# Patient Record
Sex: Female | Born: 1995 | Hispanic: No | Marital: Married | Smoking: Never smoker
Health system: Southern US, Community
[De-identification: ages and names within clinical notes are randomized; demographics above are authoritative.]

## PROBLEM LIST (undated history)

## (undated) DIAGNOSIS — Z789 Other specified health status: Secondary | ICD-10-CM

## (undated) HISTORY — DX: Other specified health status: Z78.9

## (undated) HISTORY — PX: NO PAST SURGERIES: SHX2092

---

## 2016-06-29 NOTE — L&D Delivery Note (Signed)
Delivery Note At 11:55 AM a viable female was delivered via Vaginal, Spontaneous Delivery (Presentation: direct OA).  APGAR: 9, 9; weight  .   Placenta status: spontaneous.    Anesthesia: Epidural  Episiotomy: None Lacerations: 2nd degree;Labial Suture Repair: 3.0 vicryl Est. Blood Loss (mL): 200  Mom to postpartum.  Baby to Nursery.  Al CorpusMatthew R Zeitler 11/20/2016, 12:51 PM  Patient is a 1835w0d at 3335w0d who was admitted with SROM/latent labor, uncomplicated prenatal course.  She progressed without augmentation.  I was gloved and present for delivery in its entirety.  Second stage of labor progressed to SVD.  Mild decels during second stage noted.  Complications: none  Lacerations: 2nd degree perineal/L sulcal  EBL: 200cc  SHAW, KIMBERLY, CNM 1:36 PM 11/20/2016

## 2016-10-14 ENCOUNTER — Encounter: Payer: Self-pay | Admitting: Certified Nurse Midwife

## 2016-10-15 ENCOUNTER — Ambulatory Visit (INDEPENDENT_AMBULATORY_CARE_PROVIDER_SITE_OTHER): Payer: Medicaid Other | Admitting: Student

## 2016-10-15 ENCOUNTER — Encounter: Payer: Self-pay | Admitting: Student

## 2016-10-15 DIAGNOSIS — Z3403 Encounter for supervision of normal first pregnancy, third trimester: Secondary | ICD-10-CM

## 2016-10-15 DIAGNOSIS — Z3490 Encounter for supervision of normal pregnancy, unspecified, unspecified trimester: Secondary | ICD-10-CM | POA: Insufficient documentation

## 2016-10-15 DIAGNOSIS — Z23 Encounter for immunization: Secondary | ICD-10-CM

## 2016-10-15 DIAGNOSIS — Z3400 Encounter for supervision of normal first pregnancy, unspecified trimester: Secondary | ICD-10-CM | POA: Insufficient documentation

## 2016-10-15 DIAGNOSIS — O0933 Supervision of pregnancy with insufficient antenatal care, third trimester: Secondary | ICD-10-CM | POA: Insufficient documentation

## 2016-10-15 LAB — POCT URINALYSIS DIP (DEVICE)
BILIRUBIN URINE: NEGATIVE
Glucose, UA: NEGATIVE mg/dL
HGB URINE DIPSTICK: NEGATIVE
KETONES UR: NEGATIVE mg/dL
Nitrite: NEGATIVE
PROTEIN: NEGATIVE mg/dL
Specific Gravity, Urine: 1.015 (ref 1.005–1.030)
Urobilinogen, UA: 0.2 mg/dL (ref 0.0–1.0)
pH: 7 (ref 5.0–8.0)

## 2016-10-15 NOTE — Patient Instructions (Signed)
Third Trimester of Pregnancy The third trimester is from week 28 through week 40 (months 7 through 9). The third trimester is a time when the unborn baby (fetus) is growing rapidly. At the end of the ninth month, the fetus is about 20 inches in length and weighs 6-10 pounds. Body changes during your third trimester Your body will continue to go through many changes during pregnancy. The changes vary from woman to woman. During the third trimester:  Your weight will continue to increase. You can expect to gain 25-35 pounds (11-16 kg) by the end of the pregnancy.  You may begin to get stretch marks on your hips, abdomen, and breasts.  You may urinate more often because the fetus is moving lower into your pelvis and pressing on your bladder.  You may develop or continue to have heartburn. This is caused by increased hormones that slow down muscles in the digestive tract.  You may develop or continue to have constipation because increased hormones slow digestion and cause the muscles that push waste through your intestines to relax.  You may develop hemorrhoids. These are swollen veins (varicose veins) in the rectum that can itch or be painful.  You may develop swollen, bulging veins (varicose veins) in your legs.  You may have increased body aches in the pelvis, back, or thighs. This is due to weight gain and increased hormones that are relaxing your joints.  You may have changes in your hair. These can include thickening of your hair, rapid growth, and changes in texture. Some women also have hair loss during or after pregnancy, or hair that feels dry or thin. Your hair will most likely return to normal after your baby is born.  Your breasts will continue to grow and they will continue to become tender. A yellow fluid (colostrum) may leak from your breasts. This is the first milk you are producing for your baby.  Your belly button may stick out.  You may notice more swelling in your hands,  face, or ankles.  You may have increased tingling or numbness in your hands, arms, and legs. The skin on your belly may also feel numb.  You may feel short of breath because of your expanding uterus.  You may have more problems sleeping. This can be caused by the size of your belly, increased need to urinate, and an increase in your body's metabolism.  You may notice the fetus "dropping," or moving lower in your abdomen (lightening).  You may have increased vaginal discharge.  You may notice your joints feel loose and you may have pain around your pelvic bone.  What to expect at prenatal visits You will have prenatal exams every 2 weeks until week 36. Then you will have weekly prenatal exams. During a routine prenatal visit:  You will be weighed to make sure you and the baby are growing normally.  Your blood pressure will be taken.  Your abdomen will be measured to track your baby's growth.  The fetal heartbeat will be listened to.  Any test results from the previous visit will be discussed.  You may have a cervical check near your due date to see if your cervix has softened or thinned (effaced).  You will be tested for Group B streptococcus. This happens between 35 and 37 weeks.  Your health care provider may ask you:  What your birth plan is.  How you are feeling.  If you are feeling the baby move.  If you have had   any abnormal symptoms, such as leaking fluid, bleeding, severe headaches, or abdominal cramping.  If you are using any tobacco products, including cigarettes, chewing tobacco, and electronic cigarettes.  If you have any questions.  Other tests or screenings that may be performed during your third trimester include:  Blood tests that check for low iron levels (anemia).  Fetal testing to check the health, activity level, and growth of the fetus. Testing is done if you have certain medical conditions or if there are problems during the  pregnancy.  Nonstress test (NST). This test checks the health of your baby to make sure there are no signs of problems, such as the baby not getting enough oxygen. During this test, a belt is placed around your belly. The baby is made to move, and its heart rate is monitored during movement.  What is false labor? False labor is a condition in which you feel small, irregular tightenings of the muscles in the womb (contractions) that usually go away with rest, changing position, or drinking water. These are called Braxton Hicks contractions. Contractions may last for hours, days, or even weeks before true labor sets in. If contractions come at regular intervals, become more frequent, increase in intensity, or become painful, you should see your health care provider. What are the signs of labor?  Abdominal cramps.  Regular contractions that start at 10 minutes apart and become stronger and more frequent with time.  Contractions that start on the top of the uterus and spread down to the lower abdomen and back.  Increased pelvic pressure and dull back pain.  A watery or bloody mucus discharge that comes from the vagina.  Leaking of amniotic fluid. This is also known as your "water breaking." It could be a slow trickle or a gush. Let your health care provider know if it has a color or strange odor. If you have any of these signs, call your health care provider right away, even if it is before your due date. Follow these instructions at home: Medicines  Follow your health care provider's instructions regarding medicine use. Specific medicines may be either safe or unsafe to take during pregnancy.  Take a prenatal vitamin that contains at least 600 micrograms (mcg) of folic acid.  If you develop constipation, try taking a stool softener if your health care provider approves. Eating and drinking  Eat a balanced diet that includes fresh fruits and vegetables, whole grains, good sources of protein  such as meat, eggs, or tofu, and low-fat dairy. Your health care provider will help you determine the amount of weight gain that is right for you.  Avoid raw meat and uncooked cheese. These carry germs that can cause birth defects in the baby.  If you have low calcium intake from food, talk to your health care provider about whether you should take a daily calcium supplement.  Eat four or five small meals rather than three large meals a day.  Limit foods that are high in fat and processed sugars, such as fried and sweet foods.  To prevent constipation: ? Drink enough fluid to keep your urine clear or pale yellow. ? Eat foods that are high in fiber, such as fresh fruits and vegetables, whole grains, and beans. Activity  Exercise only as directed by your health care provider. Most women can continue their usual exercise routine during pregnancy. Try to exercise for 30 minutes at least 5 days a week. Stop exercising if you experience uterine contractions.  Avoid heavy   lifting.  Do not exercise in extreme heat or humidity, or at high altitudes.  Wear low-heel, comfortable shoes.  Practice good posture.  You may continue to have sex unless your health care provider tells you otherwise. Relieving pain and discomfort  Take frequent breaks and rest with your legs elevated if you have leg cramps or low back pain.  Take warm sitz baths to soothe any pain or discomfort caused by hemorrhoids. Use hemorrhoid cream if your health care provider approves.  Wear a good support bra to prevent discomfort from breast tenderness.  If you develop varicose veins: ? Wear support pantyhose or compression stockings as told by your healthcare provider. ? Elevate your feet for 15 minutes, 3-4 times a day. Prenatal care  Write down your questions. Take them to your prenatal visits.  Keep all your prenatal visits as told by your health care provider. This is important. Safety  Wear your seat belt at  all times when driving.  Make a list of emergency phone numbers, including numbers for family, friends, the hospital, and police and fire departments. General instructions  Avoid cat litter boxes and soil used by cats. These carry germs that can cause birth defects in the baby. If you have a cat, ask someone to clean the litter box for you.  Do not travel far distances unless it is absolutely necessary and only with the approval of your health care provider.  Do not use hot tubs, steam rooms, or saunas.  Do not drink alcohol.  Do not use any products that contain nicotine or tobacco, such as cigarettes and e-cigarettes. If you need help quitting, ask your health care provider.  Do not use any medicinal herbs or unprescribed drugs. These chemicals affect the formation and growth of the baby.  Do not douche or use tampons or scented sanitary pads.  Do not cross your legs for long periods of time.  To prepare for the arrival of your baby: ? Take prenatal classes to understand, practice, and ask questions about labor and delivery. ? Make a trial run to the hospital. ? Visit the hospital and tour the maternity area. ? Arrange for maternity or paternity leave through employers. ? Arrange for family and friends to take care of pets while you are in the hospital. ? Purchase a rear-facing car seat and make sure you know how to install it in your car. ? Pack your hospital bag. ? Prepare the baby's nursery. Make sure to remove all pillows and stuffed animals from the baby's crib to prevent suffocation.  Visit your dentist if you have not gone during your pregnancy. Use a soft toothbrush to brush your teeth and be gentle when you floss. Contact a health care provider if:  You are unsure if you are in labor or if your water has broken.  You become dizzy.  You have mild pelvic cramps, pelvic pressure, or nagging pain in your abdominal area.  You have lower back pain.  You have persistent  nausea, vomiting, or diarrhea.  You have an unusual or bad smelling vaginal discharge.  You have pain when you urinate. Get help right away if:  Your water breaks before 37 weeks.  You have regular contractions less than 5 minutes apart before 37 weeks.  You have a fever.  You are leaking fluid from your vagina.  You have spotting or bleeding from your vagina.  You have severe abdominal pain or cramping.  You have rapid weight loss or weight gain.    You have shortness of breath with chest pain.  You notice sudden or extreme swelling of your face, hands, ankles, feet, or legs.  Your baby makes fewer than 10 movements in 2 hours.  You have severe headaches that do not go away when you take medicine.  You have vision changes. Summary  The third trimester is from week 28 through week 40, months 7 through 9. The third trimester is a time when the unborn baby (fetus) is growing rapidly.  During the third trimester, your discomfort may increase as you and your baby continue to gain weight. You may have abdominal, leg, and back pain, sleeping problems, and an increased need to urinate.  During the third trimester your breasts will keep growing and they will continue to become tender. A yellow fluid (colostrum) may leak from your breasts. This is the first milk you are producing for your baby.  False labor is a condition in which you feel small, irregular tightenings of the muscles in the womb (contractions) that eventually go away. These are called Braxton Hicks contractions. Contractions may last for hours, days, or even weeks before true labor sets in.  Signs of labor can include: abdominal cramps; regular contractions that start at 10 minutes apart and become stronger and more frequent with time; watery or bloody mucus discharge that comes from the vagina; increased pelvic pressure and dull back pain; and leaking of amniotic fluid. This information is not intended to replace advice  given to you by your health care provider. Make sure you discuss any questions you have with your health care provider. Document Released: 06/09/2001 Document Revised: 11/21/2015 Document Reviewed: 08/16/2012 Elsevier Interactive Patient Education  2017 [employer]  

## 2016-10-15 NOTE — Progress Notes (Signed)
  Subjective:    Kaitlyn Frank is being seen today for her first obstetrical visit.  This is a planned pregnancy. She is at [redacted]w[redacted]d gestation. Her obstetrical history is significant for nothing. Relationship with FOB: living in [place]. Patient does intend to breast feed. Pregnancy history fully reviewed. States that her pregnancy has been normal thus far and that her anatomy scan in [place] was normal.  Patient reports backache.  Review of Systems:   Review of Systems  Constitutional: Negative.   HENT: Negative.   Eyes: Negative.   Respiratory: Negative.   Cardiovascular: Negative.   Gastrointestinal: Negative.   Genitourinary: Negative.   Musculoskeletal: Negative.   Allergic/Immunologic: Negative.   Neurological: Negative.   Psychiatric/Behavioral: Negative.     Objective:     BP (!) 111/59   Pulse 91   Ht  (1.6 m)   Wt 54.5 kg (120 lb 1.6 oz)   LMP 02/17/2016   BMI 21.27 kg/m  Physical Exam  Constitutional: She is oriented to person, place, and time. She appears well-developed.  HENT:  Head: Normocephalic.  Neck: Normal range of motion.  Cardiovascular: Normal rate and regular rhythm.   Respiratory: Effort normal and breath sounds normal.  GI: Soft. Bowel sounds are normal.  Musculoskeletal: Normal range of motion.  Neurological: She is alert and oriented to person, place, and time. She has normal reflexes.  Skin: Skin is warm and dry.  Psychiatric: She has a normal mood and affect.    Exam    Assessment:    Pregnancy: G1P0000 Patient Active Problem List   Diagnosis Date Noted  . Supervision of low-risk first pregnancy 10/15/2016       Plan:      Patient Active Problem List   Diagnosis Date Noted  . Supervision of low-risk first pregnancy 10/15/2016    Initial labs drawn. 1 hour GTT today Prenatal vitamins. Problem list reviewed and updated. AFP3 discussed: too late. Role of ultrasound in pregnancy discussed; fetal survey: follow up  ordered for growth and repeat anatomy. Amniocentesis discussed: not indicated. Follow up in 1 weeks. 50% of 45 min visit spent on counseling and coordination of care.     Charlesetta Garibaldi Kooistra CNM 10/15/2016

## 2016-10-16 LAB — OBSTETRIC PANEL, INCLUDING HIV
Antibody Screen: NEGATIVE
Basophils Absolute: 0 10*3/uL (ref 0.0–0.2)
Basos: 0 %
EOS (ABSOLUTE): 0.1 10*3/uL (ref 0.0–0.4)
Eos: 1 %
HIV SCREEN 4TH GENERATION: NONREACTIVE
Hematocrit: 26.2 % — ABNORMAL LOW (ref 34.0–46.6)
Hemoglobin: 8.5 g/dL — ABNORMAL LOW (ref 11.1–15.9)
Hepatitis B Surface Ag: NEGATIVE
IMMATURE GRANULOCYTES: 1 %
Immature Grans (Abs): 0.1 10*3/uL (ref 0.0–0.1)
LYMPHS ABS: 2.2 10*3/uL (ref 0.7–3.1)
Lymphs: 27 %
MCH: 28.2 pg (ref 26.6–33.0)
MCHC: 32.4 g/dL (ref 31.5–35.7)
MCV: 87 fL (ref 79–97)
Monocytes Absolute: 0.7 10*3/uL (ref 0.1–0.9)
Monocytes: 8 %
NEUTROS ABS: 5.2 10*3/uL (ref 1.4–7.0)
NEUTROS PCT: 63 %
Platelets: 235 10*3/uL (ref 150–379)
RBC: 3.01 x10E6/uL — AB (ref 3.77–5.28)
RDW: 14.8 % (ref 12.3–15.4)
RPR Ser Ql: NONREACTIVE
Rh Factor: POSITIVE
Rubella Antibodies, IGG: 15.6 index (ref >0.99)
WBC: 8.1 10*3/uL (ref 3.4–10.8)

## 2016-10-16 LAB — HEMOGLOBINOPATHY EVALUATION
FERRITIN: 8 ng/mL — AB (ref 15–150)
HGB SOLUBILITY: NEGATIVE
Hgb A2 Quant: 2.1 % (ref 1.8–3.2)
Hgb A: 97.9 % (ref 96.4–98.8)
Hgb C: 0 %
Hgb F Quant: 0 % (ref 0.0–2.0)
Hgb S: 0 %
Hgb Variant: 0 %

## 2016-10-16 LAB — GLUCOSE TOLERANCE, 1 HOUR: GLUCOSE, 1HR PP: 114 mg/dL (ref 65–199)

## 2016-10-18 ENCOUNTER — Encounter: Payer: Self-pay | Admitting: Student

## 2016-10-18 ENCOUNTER — Telehealth: Payer: Self-pay | Admitting: Student

## 2016-10-18 ENCOUNTER — Other Ambulatory Visit: Payer: Self-pay | Admitting: Student

## 2016-10-18 DIAGNOSIS — O99013 Anemia complicating pregnancy, third trimester: Secondary | ICD-10-CM | POA: Insufficient documentation

## 2016-10-18 MED ORDER — FERROUS SULFATE 325 (65 FE) MG PO TABS: 325.0000 mg | ORAL_TABLET | Freq: Two times a day (BID) | ORAL | Status: AC

## 2016-10-18 NOTE — Telephone Encounter (Signed)
RX for 325 mg of ferrous sulfate daily sent to pharmacy. Spoke with Patient; informed her of her low iron.  Patient had just bought her own iron pills and she planned to take them first. I verified that they were the same dosage. Informed patient of importance of taking her iron with citrus and avoiding any dairy products 30 min before and after taking the pills. Also recommended stool softener for potential constipation. Patient verbalized understanding; all questions answered.

## 2016-10-19 LAB — CULTURE, OB URINE

## 2016-10-19 LAB — URINE CULTURE, OB REFLEX

## 2016-10-26 ENCOUNTER — Other Ambulatory Visit (HOSPITAL_COMMUNITY)
Admission: RE | Admit: 2016-10-26 | Discharge: 2016-10-26 | Disposition: A | Discharge status: Home / Self Care | Payer: Medicaid Other | Source: Ambulatory Visit | Attending: Advanced Practice Midwife | Admitting: Advanced Practice Midwife

## 2016-10-26 ENCOUNTER — Encounter: Payer: Self-pay | Admitting: Advanced Practice Midwife

## 2016-10-26 ENCOUNTER — Ambulatory Visit (INDEPENDENT_AMBULATORY_CARE_PROVIDER_SITE_OTHER): Payer: Medicaid Other | Admitting: Advanced Practice Midwife

## 2016-10-26 ENCOUNTER — Ambulatory Visit (HOSPITAL_COMMUNITY)
Admission: RE | Admit: 2016-10-26 | Discharge: 2016-10-26 | Disposition: A | Discharge status: Home / Self Care | Payer: Medicaid Other | Source: Ambulatory Visit | Attending: Student | Admitting: Student

## 2016-10-26 VITALS — BP 98/68 | HR 111 | Wt 121.1 lb

## 2016-10-26 DIAGNOSIS — O0933 Supervision of pregnancy with insufficient antenatal care, third trimester: Secondary | ICD-10-CM | POA: Diagnosis not present

## 2016-10-26 DIAGNOSIS — Z113 Encounter for screening for infections with a predominantly sexual mode of transmission: Secondary | ICD-10-CM

## 2016-10-26 DIAGNOSIS — Z3403 Encounter for supervision of normal first pregnancy, third trimester: Secondary | ICD-10-CM

## 2016-10-26 DIAGNOSIS — Z3A36 36 weeks gestation of pregnancy: Secondary | ICD-10-CM | POA: Insufficient documentation

## 2016-10-26 MED ORDER — PENICILLIN V POTASSIUM 500 MG PO TABS: 500.0000 mg | ORAL_TABLET | Freq: Four times a day (QID) | ORAL | 0 refills | Status: DC

## 2016-10-26 NOTE — Progress Notes (Signed)
   PRENATAL VISIT NOTE  Subjective:  Kaitlyn Frank is a 21 y.o. G1P0000 at [redacted]w[redacted]d being seen today for ongoing prenatal care.  She is currently monitored for the following issues for this low-risk pregnancy and has Supervision of low-risk first pregnancy; Late prenatal care affecting pregnancy in third trimester; and Anemia affecting pregnancy in third trimester on her problem list.  Patient reports occasional contractions.  Contractions: Irregular. Vag. Bleeding: None.  Movement: Present. Denies leaking of fluid.   The following portions of the patient's history were reviewed and updated as appropriate: allergies, current medications, past family history, past medical history, past social history, past surgical history and problem list. Problem list updated.  Objective:   Vitals:   10/26/16 1254  BP: 98/68  Pulse: (!) 111  Weight: 121 lb 1.6 oz (54.9 kg)    Fetal Status: Fetal Heart Rate (bpm): 142   Movement: Present     General:  Alert, oriented and cooperative. Patient is in no acute distress.  Skin: Skin is warm and dry. No rash noted.   Cardiovascular: Normal heart rate noted  Respiratory: Normal respiratory effort, no problems with respiration noted  Abdomen: Soft, gravid, appropriate for gestational age. Pain/Pressure: Present     Pelvic:  Cervical exam performed       Closed/40/Ballot/vertex  Extremities: Normal range of motion.  Edema: None  Mental Status: Normal mood and affect. Normal behavior. Normal judgment and thought content.   Assessment and Plan:  Pregnancy: G1P0000 at [redacted]w[redacted]d  1. Encounter for supervision of low-risk first pregnancy in third trimester      GBS not done due to Presence in urine - GC/Chlamydia probe amp ([facility])not at [facility]  2. Late prenatal care affecting pregnancy in third trimester      GBS bacteruria        Rx PCN x 7d sent to pharmacy       Will treat in labor - GC/Chlamydia probe amp ()not at [facility]  Term labor  symptoms and general obstetric precautions including but not limited to vaginal bleeding, contractions, leaking of fluid and fetal movement were reviewed in detail with the patient. Please refer to After Visit Summary for other counseling recommendations.  Return in 1 week (on 11/02/2016).   Aviva Signs, CNM

## 2016-10-26 NOTE — Patient Instructions (Addendum)
Third Trimester of Pregnancy The third trimester is from week 28 through week 40 (months 7 through 9). The third trimester is a time when the unborn baby (fetus) is growing rapidly. At the end of the ninth month, the fetus is about 20 inches in length and weighs 6-10 pounds. Body changes during your third trimester Your body will continue to go through many changes during pregnancy. The changes vary from woman to woman. During the third trimester:  Your weight will continue to increase. You can expect to gain 25-35 pounds (11-16 kg) by the end of the pregnancy.  You may begin to get stretch marks on your hips, abdomen, and breasts.  You may urinate more often because the fetus is moving lower into your pelvis and pressing on your bladder.  You may develop or continue to have heartburn. This is caused by increased hormones that slow down muscles in the digestive tract.  You may develop or continue to have constipation because increased hormones slow digestion and cause the muscles that push waste through your intestines to relax.  You may develop hemorrhoids. These are swollen veins (varicose veins) in the rectum that can itch or be painful.  You may develop swollen, bulging veins (varicose veins) in your legs.  You may have increased body aches in the pelvis, back, or thighs. This is due to weight gain and increased hormones that are relaxing your joints.  You may have changes in your hair. These can include thickening of your hair, rapid growth, and changes in texture. Some women also have hair loss during or after pregnancy, or hair that feels dry or thin. Your hair will most likely return to normal after your baby is born.  Your breasts will continue to grow and they will continue to become tender. A yellow fluid (colostrum) may leak from your breasts. This is the first milk you are producing for your baby.  Your belly button may stick out.  You may notice more swelling in your hands,  face, or ankles.  You may have increased tingling or numbness in your hands, arms, and legs. The skin on your belly may also feel numb.  You may feel short of breath because of your expanding uterus.  You may have more problems sleeping. This can be caused by the size of your belly, increased need to urinate, and an increase in your body's metabolism.  You may notice the fetus "dropping," or moving lower in your abdomen (lightening).  You may have increased vaginal discharge.  You may notice your joints feel loose and you may have pain around your pelvic bone.  What to expect at prenatal visits You will have prenatal exams every 2 weeks until week 36. Then you will have weekly prenatal exams. During a routine prenatal visit:  You will be weighed to make sure you and the baby are growing normally.  Your blood pressure will be taken.  Your abdomen will be measured to track your baby's growth.  The fetal heartbeat will be listened to.  Any test results from the previous visit will be discussed.  You may have a cervical check near your due date to see if your cervix has softened or thinned (effaced).  You will be tested for Group B streptococcus. This happens between 35 and 37 weeks.  Your health care provider may ask you:  What your birth plan is.  How you are feeling.  If you are feeling the baby move.  If you have had   any abnormal symptoms, such as leaking fluid, bleeding, severe headaches, or abdominal cramping.  If you are using any tobacco products, including cigarettes, chewing tobacco, and electronic cigarettes.  If you have any questions.  Other tests or screenings that may be performed during your third trimester include:  Blood tests that check for low iron levels (anemia).  Fetal testing to check the health, activity level, and growth of the fetus. Testing is done if you have certain medical conditions or if there are problems during the  pregnancy.  Nonstress test (NST). This test checks the health of your baby to make sure there are no signs of problems, such as the baby not getting enough oxygen. During this test, a belt is placed around your belly. The baby is made to move, and its heart rate is monitored during movement.  What is false labor? False labor is a condition in which you feel small, irregular tightenings of the muscles in the womb (contractions) that usually go away with rest, changing position, or drinking water. These are called Braxton Hicks contractions. Contractions may last for hours, days, or even weeks before true labor sets in. If contractions come at regular intervals, become more frequent, increase in intensity, or become painful, you should see your health care provider. What are the signs of labor?  Abdominal cramps.  Regular contractions that start at 10 minutes apart and become stronger and more frequent with time.  Contractions that start on the top of the uterus and spread down to the lower abdomen and back.  Increased pelvic pressure and dull back pain.  A watery or bloody mucus discharge that comes from the vagina.  Leaking of amniotic fluid. This is also known as your "water breaking." It could be a slow trickle or a gush. Let your health care provider know if it has a color or strange odor. If you have any of these signs, call your health care provider right away, even if it is before your due date. Follow these instructions at home: Medicines  Follow your health care provider's instructions regarding medicine use. Specific medicines may be either safe or unsafe to take during pregnancy.  Take a prenatal vitamin that contains at least 600 micrograms (mcg) of folic acid.  If you develop constipation, try taking a stool softener if your health care provider approves. Eating and drinking  Eat a balanced diet that includes fresh fruits and vegetables, whole grains, good sources of protein  such as meat, eggs, or tofu, and low-fat dairy. Your health care provider will help you determine the amount of weight gain that is right for you.  Avoid raw meat and uncooked cheese. These carry germs that can cause birth defects in the baby.  If you have low calcium intake from food, talk to your health care provider about whether you should take a daily calcium supplement.  Eat four or five small meals rather than three large meals a day.  Limit foods that are high in fat and processed sugars, such as fried and sweet foods.  To prevent constipation: ? Drink enough fluid to keep your urine clear or pale yellow. ? Eat foods that are high in fiber, such as fresh fruits and vegetables, whole grains, and beans. Activity  Exercise only as directed by your health care provider. Most women can continue their usual exercise routine during pregnancy. Try to exercise for 30 minutes at least 5 days a week. Stop exercising if you experience uterine contractions.  Avoid heavy   lifting.  Do not exercise in extreme heat or humidity, or at high altitudes.  Wear low-heel, comfortable shoes.  Practice good posture.  You may continue to have sex unless your health care provider tells you otherwise. Relieving pain and discomfort  Take frequent breaks and rest with your legs elevated if you have leg cramps or low back pain.  Take warm sitz baths to soothe any pain or discomfort caused by hemorrhoids. Use hemorrhoid cream if your health care provider approves.  Wear a good support bra to prevent discomfort from breast tenderness.  If you develop varicose veins: ? Wear support pantyhose or compression stockings as told by your healthcare provider. ? Elevate your feet for 15 minutes, 3-4 times a day. Prenatal care  Write down your questions. Take them to your prenatal visits.  Keep all your prenatal visits as told by your health care provider. This is important. Safety  Wear your seat belt at  all times when driving.  Make a list of emergency phone numbers, including numbers for family, friends, the hospital, and police and fire departments. General instructions  Avoid cat litter boxes and soil used by cats. These carry germs that can cause birth defects in the baby. If you have a cat, ask someone to clean the litter box for you.  Do not travel far distances unless it is absolutely necessary and only with the approval of your health care provider.  Do not use hot tubs, steam rooms, or saunas.  Do not drink alcohol.  Do not use any products that contain nicotine or tobacco, such as cigarettes and e-cigarettes. If you need help quitting, ask your health care provider.  Do not use any medicinal herbs or unprescribed drugs. These chemicals affect the formation and growth of the baby.  Do not douche or use tampons or scented sanitary pads.  Do not cross your legs for long periods of time.  To prepare for the arrival of your baby: ? Take prenatal classes to understand, practice, and ask questions about labor and delivery. ? Make a trial run to the hospital. ? Visit the hospital and tour the maternity area. ? Arrange for maternity or paternity leave through employers. ? Arrange for family and friends to take care of pets while you are in the hospital. ? Purchase a rear-facing car seat and make sure you know how to install it in your car. ? Pack your hospital bag. ? Prepare the baby's nursery. Make sure to remove all pillows and stuffed animals from the baby's crib to prevent suffocation.  Visit your dentist if you have not gone during your pregnancy. Use a soft toothbrush to brush your teeth and be gentle when you floss. Contact a health care provider if:  You are unsure if you are in labor or if your water has broken.  You become dizzy.  You have mild pelvic cramps, pelvic pressure, or nagging pain in your abdominal area.  You have lower back pain.  You have persistent  nausea, vomiting, or diarrhea.  You have an unusual or bad smelling vaginal discharge.  You have pain when you urinate. Get help right away if:  Your water breaks before 37 weeks.  You have regular contractions less than 5 minutes apart before 37 weeks.  You have a fever.  You are leaking fluid from your vagina.  You have spotting or bleeding from your vagina.  You have severe abdominal pain or cramping.  You have rapid weight loss or weight gain.    You have shortness of breath with chest pain.  You notice sudden or extreme swelling of your face, hands, ankles, feet, or legs.  Your baby makes fewer than 10 movements in 2 hours.  You have severe headaches that do not go away when you take medicine.  You have vision changes. Summary  The third trimester is from week 28 through week 40, months 7 through 9. The third trimester is a time when the unborn baby (fetus) is growing rapidly.  During the third trimester, your discomfort may increase as you and your baby continue to gain weight. You may have abdominal, leg, and back pain, sleeping problems, and an increased need to urinate.  During the third trimester your breasts will keep growing and they will continue to become tender. A yellow fluid (colostrum) may leak from your breasts. This is the first milk you are producing for your baby.  False labor is a condition in which you feel small, irregular tightenings of the muscles in the womb (contractions) that eventually go away. These are called Braxton Hicks contractions. Contractions may last for hours, days, or even weeks before true labor sets in.  Signs of labor can include: abdominal cramps; regular contractions that start at 10 minutes apart and become stronger and more frequent with time; watery or bloody mucus discharge that comes from the vagina; increased pelvic pressure and dull back pain; and leaking of amniotic fluid. This information is not intended to replace advice  given to you by your health care provider. Make sure you discuss any questions you have with your health care provider. Document Released: 06/09/2001 Document Revised: 11/21/2015 Document Reviewed: 08/16/2012 [employer] Interactive Patient Education  2017 [employer]  Third Trimester of Pregnancy The third trimester is from week 28 through week 40 (months 7 through 9). The third trimester is a time when the unborn baby (fetus) is growing rapidly. At the end of the ninth month, the fetus is about 20 inches in length and weighs 6-10 pounds. Body changes during your third trimester Your body will continue to go through many changes during pregnancy. The changes vary from woman to woman. During the third trimester:  Your weight will continue to increase. You can expect to gain 25-35 pounds (11-16 kg) by the end of the pregnancy.  You may begin to get stretch marks on your hips, abdomen, and breasts.  You may urinate more often because the fetus is moving lower into your pelvis and pressing on your bladder.  You may develop or continue to have heartburn. This is caused by increased hormones that slow down muscles in the digestive tract.  You may develop or continue to have constipation because increased hormones slow digestion and cause the muscles that push waste through your intestines to relax.  You may develop hemorrhoids. These are swollen veins (varicose veins) in the rectum that can itch or be painful.  You may develop swollen, bulging veins (varicose veins) in your legs.  You may have increased body aches in the pelvis, back, or thighs. This is due to weight gain and increased hormones that are relaxing your joints.  You may have changes in your hair. These can include thickening of your hair, rapid growth, and changes in texture. Some women also have hair loss during or after pregnancy, or hair that feels dry or thin. Your hair will most likely return to normal after your baby is  born.  Your breasts will continue to grow and they will continue to become   tender. A yellow fluid (colostrum) may leak from your breasts. This is the first milk you are producing for your baby.  Your belly button may stick out.  You may notice more swelling in your hands, face, or ankles.  You may have increased tingling or numbness in your hands, arms, and legs. The skin on your belly may also feel numb.  You may feel short of breath because of your expanding uterus.  You may have more problems sleeping. This can be caused by the size of your belly, increased need to urinate, and an increase in your body's metabolism.  You may notice the fetus "dropping," or moving lower in your abdomen (lightening).  You may have increased vaginal discharge.  You may notice your joints feel loose and you may have pain around your pelvic bone.  What to expect at prenatal visits You will have prenatal exams every 2 weeks until week 36. Then you will have weekly prenatal exams. During a routine prenatal visit:  You will be weighed to make sure you and the baby are growing normally.  Your blood pressure will be taken.  Your abdomen will be measured to track your baby's growth.  The fetal heartbeat will be listened to.  Any test results from the previous visit will be discussed.  You may have a cervical check near your due date to see if your cervix has softened or thinned (effaced).  You will be tested for Group B streptococcus. This happens between 35 and 37 weeks.  Your health care provider may ask you:  What your birth plan is.  How you are feeling.  If you are feeling the baby move.  If you have had any abnormal symptoms, such as leaking fluid, bleeding, severe headaches, or abdominal cramping.  If you are using any tobacco products, including cigarettes, chewing tobacco, and electronic cigarettes.  If you have any questions.  Other tests or screenings that may be performed during  your third trimester include:  Blood tests that check for low iron levels (anemia).  Fetal testing to check the health, activity level, and growth of the fetus. Testing is done if you have certain medical conditions or if there are problems during the pregnancy.  Nonstress test (NST). This test checks the health of your baby to make sure there are no signs of problems, such as the baby not getting enough oxygen. During this test, a belt is placed around your belly. The baby is made to move, and its heart rate is monitored during movement.  What is false labor? False labor is a condition in which you feel small, irregular tightenings of the muscles in the womb (contractions) that usually go away with rest, changing position, or drinking water. These are called Braxton Hicks contractions. Contractions may last for hours, days, or even weeks before true labor sets in. If contractions come at regular intervals, become more frequent, increase in intensity, or become painful, you should see your health care provider. What are the signs of labor?  Abdominal cramps.  Regular contractions that start at 10 minutes apart and become stronger and more frequent with time.  Contractions that start on the top of the uterus and spread down to the lower abdomen and back.  Increased pelvic pressure and dull back pain.  A watery or bloody mucus discharge that comes from the vagina.  Leaking of amniotic fluid. This is also known as your "water breaking." It could be a slow trickle or a gush. Let   your health care provider know if it has a color or strange odor. If you have any of these signs, call your health care provider right away, even if it is before your due date. Follow these instructions at home: Medicines  Follow your health care provider's instructions regarding medicine use. Specific medicines may be either safe or unsafe to take during pregnancy.  Take a prenatal vitamin that contains at least 600  micrograms (mcg) of folic acid.  If you develop constipation, try taking a stool softener if your health care provider approves. Eating and drinking  Eat a balanced diet that includes fresh fruits and vegetables, whole grains, good sources of protein such as meat, eggs, or tofu, and low-fat dairy. Your health care provider will help you determine the amount of weight gain that is right for you.  Avoid raw meat and uncooked cheese. These carry germs that can cause birth defects in the baby.  If you have low calcium intake from food, talk to your health care provider about whether you should take a daily calcium supplement.  Eat four or five small meals rather than three large meals a day.  Limit foods that are high in fat and processed sugars, such as fried and sweet foods.  To prevent constipation: ? Drink enough fluid to keep your urine clear or pale yellow. ? Eat foods that are high in fiber, such as fresh fruits and vegetables, whole grains, and beans. Activity  Exercise only as directed by your health care provider. Most women can continue their usual exercise routine during pregnancy. Try to exercise for 30 minutes at least 5 days a week. Stop exercising if you experience uterine contractions.  Avoid heavy lifting.  Do not exercise in extreme heat or humidity, or at high altitudes.  Wear low-heel, comfortable shoes.  Practice good posture.  You may continue to have sex unless your health care provider tells you otherwise. Relieving pain and discomfort  Take frequent breaks and rest with your legs elevated if you have leg cramps or low back pain.  Take warm sitz baths to soothe any pain or discomfort caused by hemorrhoids. Use hemorrhoid cream if your health care provider approves.  Wear a good support bra to prevent discomfort from breast tenderness.  If you develop varicose veins: ? Wear support pantyhose or compression stockings as told by your healthcare  provider. ? Elevate your feet for 15 minutes, 3-4 times a day. Prenatal care  Write down your questions. Take them to your prenatal visits.  Keep all your prenatal visits as told by your health care provider. This is important. Safety  Wear your seat belt at all times when driving.  Make a list of emergency phone numbers, including numbers for family, friends, the hospital, and police and fire departments. General instructions  Avoid cat litter boxes and soil used by cats. These carry germs that can cause birth defects in the baby. If you have a cat, ask someone to clean the litter box for you.  Do not travel far distances unless it is absolutely necessary and only with the approval of your health care provider.  Do not use hot tubs, steam rooms, or saunas.  Do not drink alcohol.  Do not use any products that contain nicotine or tobacco, such as cigarettes and e-cigarettes. If you need help quitting, ask your health care provider.  Do not use any medicinal herbs or unprescribed drugs. These chemicals affect the formation and growth of the baby.  Do   not douche or use tampons or scented sanitary pads.  Do not cross your legs for long periods of time.  To prepare for the arrival of your baby: ? Take prenatal classes to understand, practice, and ask questions about labor and delivery. ? Make a trial run to the hospital. ? Visit the hospital and tour the maternity area. ? Arrange for maternity or paternity leave through employers. ? Arrange for family and friends to take care of pets while you are in the hospital. ? Purchase a rear-facing car seat and make sure you know how to install it in your car. ? Pack your hospital bag. ? Prepare the baby's nursery. Make sure to remove all pillows and stuffed animals from the baby's crib to prevent suffocation.  Visit your dentist if you have not gone during your pregnancy. Use a soft toothbrush to brush your teeth and be gentle when you  floss. Contact a health care provider if:  You are unsure if you are in labor or if your water has broken.  You become dizzy.  You have mild pelvic cramps, pelvic pressure, or nagging pain in your abdominal area.  You have lower back pain.  You have persistent nausea, vomiting, or diarrhea.  You have an unusual or bad smelling vaginal discharge.  You have pain when you urinate. Get help right away if:  Your water breaks before 37 weeks.  You have regular contractions less than 5 minutes apart before 37 weeks.  You have a fever.  You are leaking fluid from your vagina.  You have spotting or bleeding from your vagina.  You have severe abdominal pain or cramping.  You have rapid weight loss or weight gain.  You have shortness of breath with chest pain.  You notice sudden or extreme swelling of your face, hands, ankles, feet, or legs.  Your baby makes fewer than 10 movements in 2 hours.  You have severe headaches that do not go away when you take medicine.  You have vision changes. Summary  The third trimester is from week 28 through week 40, months 7 through 9. The third trimester is a time when the unborn baby (fetus) is growing rapidly.  During the third trimester, your discomfort may increase as you and your baby continue to gain weight. You may have abdominal, leg, and back pain, sleeping problems, and an increased need to urinate.  During the third trimester your breasts will keep growing and they will continue to become tender. A yellow fluid (colostrum) may leak from your breasts. This is the first milk you are producing for your baby.  False labor is a condition in which you feel small, irregular tightenings of the muscles in the womb (contractions) that eventually go away. These are called Braxton Hicks contractions. Contractions may last for hours, days, or even weeks before true labor sets in.  Signs of labor can include: abdominal cramps; regular  contractions that start at 10 minutes apart and become stronger and more frequent with time; watery or bloody mucus discharge that comes from the vagina; increased pelvic pressure and dull back pain; and leaking of amniotic fluid. This information is not intended to replace advice given to you by your health care provider. Make sure you discuss any questions you have with your health care provider. Document Released: 06/09/2001 Document Revised: 11/21/2015 Document Reviewed: 08/16/2012 [employer] Interactive Patient Education  2017 [employer]  

## 2016-10-27 LAB — GC/CHLAMYDIA PROBE AMP (~~LOC~~) NOT AT ARMC
Chlamydia: NEGATIVE
Neisseria Gonorrhea: NEGATIVE

## 2016-11-03 ENCOUNTER — Ambulatory Visit (INDEPENDENT_AMBULATORY_CARE_PROVIDER_SITE_OTHER): Payer: Medicaid Other | Admitting: Obstetrics and Gynecology

## 2016-11-03 VITALS — BP 98/60 | HR 84 | Wt 123.0 lb

## 2016-11-03 DIAGNOSIS — Z3403 Encounter for supervision of normal first pregnancy, third trimester: Secondary | ICD-10-CM

## 2016-11-03 NOTE — Patient Instructions (Signed)
Braxton Hicks Contractions Contractions of the uterus can occur throughout pregnancy, but they are not always a sign that you are in labor. You may have practice contractions called Braxton Hicks contractions. These false labor contractions are sometimes confused with true labor. What are Braxton Hicks contractions? Braxton Hicks contractions are tightening movements that occur in the muscles of the uterus before labor. Unlike true labor contractions, these contractions do not result in opening (dilation) and thinning of the cervix. Toward the end of pregnancy (32-34 weeks), Braxton Hicks contractions can happen more often and may become stronger. These contractions are sometimes difficult to tell apart from true labor because they can be very uncomfortable. You should not feel embarrassed if you go to the hospital with false labor. Sometimes, the only way to tell if you are in true labor is for your health care provider to look for changes in the cervix. The health care provider will do a physical exam and may monitor your contractions. If you are not in true labor, the exam should show that your cervix is not dilating and your water has not broken. If there are no prenatal problems or other health problems associated with your pregnancy, it is completely safe for you to be sent home with false labor. You may continue to have Braxton Hicks contractions until you go into true labor. How can I tell the difference between true labor and false labor?  Differences ? False labor ? Contractions last 30-70 seconds.: Contractions are usually shorter and not as strong as true labor contractions. ? Contractions become very regular.: Contractions are usually irregular. ? Discomfort is usually felt in the top of the uterus, and it spreads to the lower abdomen and low back.: Contractions are often felt in the front of the lower abdomen and in the groin. ? Contractions do not go away with walking.: Contractions may  go away when you walk around or change positions while lying down. ? Contractions usually become more intense and increase in frequency.: Contractions get weaker and are shorter-lasting as time goes on. ? The cervix dilates and gets thinner.: The cervix usually does not dilate or become thin. Follow these instructions at home:  Take over-the-counter and prescription medicines only as told by your health care provider.  Keep up with your usual exercises and follow other instructions from your health care provider.  Eat and drink lightly if you think you are going into labor.  If Braxton Hicks contractions are making you uncomfortable: ? Change your position from lying down or resting to walking, or change from walking to resting. ? Sit and rest in a tub of warm water. ? Drink enough fluid to keep your urine clear or pale yellow. Dehydration may cause these contractions. ? Do slow and deep breathing several times an hour.  Keep all follow-up prenatal visits as told by your health care provider. This is important. Contact a health care provider if:  You have a fever.  You have continuous pain in your abdomen. Get help right away if:  Your contractions become stronger, more regular, and closer together.  You have fluid leaking or gushing from your vagina.  You pass blood-tinged mucus (bloody show).  You have bleeding from your vagina.  You have low back pain that you never had before.  You feel your baby's head pushing down and causing pelvic pressure.  Your baby is not moving inside you as much as it used to. Summary  Contractions that occur before labor are   called Braxton Hicks contractions, false labor, or practice contractions.  Braxton Hicks contractions are usually shorter, weaker, farther apart, and less regular than true labor contractions. True labor contractions usually become progressively stronger and regular and they become more frequent.  Manage discomfort from  [facility] contractions by changing position, resting in a warm bath, drinking plenty of water, or practicing deep breathing. This information is not intended to replace advice given to you by your health care provider. Make sure you discuss any questions you have with your health care provider. Document Released: 06/15/2005 Document Revised: 05/04/2016 Document Reviewed: 05/04/2016 Elsevier Interactive Patient Education  2017 [employer] Third Trimester of Pregnancy The third trimester is from week 29 through week 42, months 7 through 9. This trimester is when your unborn baby (fetus) is growing very fast. At the end of the ninth month, the unborn baby is about 20 inches in length. It weighs about 6-10 pounds. Follow these instructions at home:  Avoid all smoking, herbs, and alcohol. Avoid drugs not approved by your doctor.  Do not use any tobacco products, including cigarettes, chewing tobacco, and electronic cigarettes. If you need help quitting, ask your doctor. You may get counseling or other support to help you quit.  Only take medicine as told by your doctor. Some medicines are safe and some are not during pregnancy.  Exercise only as told by your doctor. Stop exercising if you start having cramps.  Eat regular, healthy meals.  Wear a good support bra if your breasts are tender.  Do not use hot tubs, steam rooms, or saunas.  Wear your seat belt when driving.  Avoid raw meat, uncooked cheese, and liter boxes and soil used by cats.  Take your prenatal vitamins.  Take 1500-2000 milligrams of calcium daily starting at the 20th week of pregnancy until you deliver your baby.  Try taking medicine that helps you poop (stool softener) as needed, and if your doctor approves. Eat more fiber by eating fresh fruit, vegetables, and whole grains. Drink enough fluids to keep your pee (urine) clear or pale yellow.  Take warm water baths (sitz baths) to soothe pain or discomfort caused by  hemorrhoids. Use hemorrhoid cream if your doctor approves.  If you have puffy, bulging veins (varicose veins), wear support hose. Raise (elevate) your feet for 15 minutes, 3-4 times a day. Limit salt in your diet.  Avoid heavy lifting, wear low heels, and sit up straight.  Rest with your legs raised if you have leg cramps or low back pain.  Visit your dentist if you have not gone during your pregnancy. Use a soft toothbrush to brush your teeth. Be gentle when you floss.  You can have sex (intercourse) unless your doctor tells you not to.  Do not travel far distances unless you must. Only do so with your doctor's approval.  Take prenatal classes.  Practice driving to the hospital.  Pack your hospital bag.  Prepare the baby's room.  Go to your doctor visits. Get help if:  You are not sure if you are in labor or if your water has broken.  You are dizzy.  You have mild cramps or pressure in your lower belly (abdominal).  You have a nagging pain in your belly area.  You continue to feel sick to your stomach (nauseous), throw up (vomit), or have watery poop (diarrhea).  You have bad smelling fluid coming from your vagina.  You have pain with peeing (urination). Get help right away if:  You have a fever. °· You are leaking fluid from your vagina. °· You are spotting or bleeding from your vagina. °· You have severe belly cramping or pain. °· You lose or gain weight rapidly. °· You have trouble catching your breath and have chest pain. °· You notice sudden or extreme puffiness (swelling) of your face, hands, ankles, feet, or legs. °· You have not felt the baby move in over an hour. °· You have severe headaches that do not go away with medicine. °· You have vision changes. °This information is not intended to replace advice given to you by your health care provider. Make sure you discuss any questions you have with your health care provider. °Document Released: 09/09/2009 Document Revised: 11/21/2015 Document  Reviewed: 08/16/2012 °Elsevier Interactive Patient Education © 2017 [employer] ° °

## 2016-11-03 NOTE — Progress Notes (Signed)
   PRENATAL VISIT NOTE  Subjective:  Kaitlyn Frank is a 21 y.o. G1P0000 at 4588w4d being seen today for ongoing prenatal care.  She is currently monitored for the following issues for this low-risk pregnancy and has Supervision of low-risk first pregnancy; Late prenatal care affecting pregnancy in third trimester; and Anemia affecting pregnancy in third trimester on her problem list.  Patient reports occasional contractions.  Contractions: Not present. Vag. Bleeding: None.  Movement: Present. Denies leaking of fluid.   The following portions of the patient's history were reviewed and updated as appropriate: allergies, current medications, past family history, past medical history, past social history, past surgical history and problem list. Problem list updated.  Objective:   Vitals:   11/03/16 0851  BP: 98/60  Pulse: 84  Weight: 123 lb (55.8 kg)    Fetal Status: Fetal Heart Rate (bpm): 146 Fundal Height: 37 cm Movement: Present  Presentation: Vertex  General:  Alert, oriented and cooperative. Patient is in no acute distress.  Skin: Skin is warm and dry. No rash noted.   Cardiovascular: Normal heart rate noted  Respiratory: Normal respiratory effort, no problems with respiration noted  Abdomen: Soft, gravid, appropriate for gestational age. Pain/Pressure: Present     Pelvic:  Cervical exam performed Dilation: Closed Effacement (%): 70 Station: -3  Extremities: Normal range of motion.  Edema: None  Mental Status: Normal mood and affect. Normal behavior. Normal judgment and thought content.   Assessment and Plan:  Pregnancy: G1P0000 at 5088w4d  There are no diagnoses linked to this encounter. Term labor symptoms and general obstetric precautions including but not limited to vaginal bleeding, contractions, leaking of fluid and fetal movement were reviewed in detail with the patient. Please refer to After Visit Summary for other counseling recommendations.  Return in about 1  week (around 11/10/2016) for Return OB visit.   Raelyn Moraawson, Floetta Brickey, CNM

## 2016-11-12 ENCOUNTER — Ambulatory Visit (INDEPENDENT_AMBULATORY_CARE_PROVIDER_SITE_OTHER): Payer: Medicaid Other | Admitting: Obstetrics and Gynecology

## 2016-11-12 VITALS — BP 106/64 | HR 86 | Wt 123.6 lb

## 2016-11-12 DIAGNOSIS — Z3403 Encounter for supervision of normal first pregnancy, third trimester: Secondary | ICD-10-CM

## 2016-11-12 NOTE — Patient Instructions (Signed)

## 2016-11-12 NOTE — Progress Notes (Signed)
   PRENATAL VISIT NOTE  Subjective:  Kaitlyn Frank is a 21 y.o. G1P0000 at [redacted]w[redacted]d being seen today for ongoing prenatal care.  She is currently monitored for the following issues for this low-risk pregnancy and has Supervision of low-risk first pregnancy; Late prenatal care affecting pregnancy in third trimester; and Anemia affecting pregnancy in third trimester on her problem list.  Patient reports no complaints.  Contractions: Irregular. Vag. Bleeding: None.  Movement: Present. Denies leaking of fluid.   The following portions of the patient's history were reviewed and updated as appropriate: allergies, current medications, past family history, past medical history, past social history, past surgical history and problem list. Problem list updated.  Objective:   Vitals:   11/12/16 1410  BP: 106/64  Pulse: 86  Weight: 123 lb 9.6 oz (56.1 kg)    Fetal Status: Fetal Heart Rate (bpm): 138   Movement: Present     General:  Alert, oriented and cooperative. Patient is in no acute distress.  Skin: Skin is warm and dry. No rash noted.   Cardiovascular: Normal heart rate noted  Respiratory: Normal respiratory effort, no problems with respiration noted  Abdomen: Soft, gravid, appropriate for gestational age. Pain/Pressure: Present     Pelvic:  Cervical exam deferred        Extremities: Normal range of motion.  Edema: None  Mental Status: Normal mood and affect. Normal behavior. Normal judgment and thought content.   Assessment and Plan:  Pregnancy: G1P0000 at [redacted]w[redacted]d  1. Encounter for supervision of low-risk first pregnancy in third trimester - doing well - some braxton hicks contractions - up to date - GBS positive - antibiotics in labor  Preterm labor symptoms and general obstetric precautions including but not limited to vaginal bleeding, contractions, leaking of fluid and fetal movement were reviewed in detail with the patient. Please refer to After Visit Summary for other  counseling recommendations.  No Follow-up on file.   Ernestina PennaNicholas Schenk, MD

## 2016-11-18 ENCOUNTER — Encounter: Payer: Self-pay | Admitting: Advanced Practice Midwife

## 2016-11-18 ENCOUNTER — Ambulatory Visit (INDEPENDENT_AMBULATORY_CARE_PROVIDER_SITE_OTHER): Payer: Medicaid Other | Admitting: Advanced Practice Midwife

## 2016-11-18 VITALS — BP 109/68 | HR 78 | Wt 125.5 lb

## 2016-11-18 DIAGNOSIS — Z3403 Encounter for supervision of normal first pregnancy, third trimester: Secondary | ICD-10-CM

## 2016-11-18 DIAGNOSIS — O0933 Supervision of pregnancy with insufficient antenatal care, third trimester: Secondary | ICD-10-CM

## 2016-11-18 DIAGNOSIS — D649 Anemia, unspecified: Secondary | ICD-10-CM

## 2016-11-18 DIAGNOSIS — O99013 Anemia complicating pregnancy, third trimester: Secondary | ICD-10-CM

## 2016-11-18 MED ORDER — PRENATAL 27-0.8 MG PO TABS: 1.0000 | ORAL_TABLET | Freq: Every day | ORAL | 12 refills | Status: DC

## 2016-11-18 NOTE — Patient Instructions (Addendum)
AREA PEDIATRIC/FAMILY PRACTICE PHYSICIANS   [facility] [address] [place], [place]  [address] Phone - [phone]   Fax - [phone]  [facility] [address] [place], [place] [address] Phone - [phone]   Fax - [phone]  JACK AMOS [address] [place], [place]  [address] Phone - [phone]   Fax - [phone]  [facility] [address] [place], [place]  [address] Phone - [phone]   Fax - [phone]  [facility] [address] [place], [place]  [address] Phone - [phone]   Fax - [phone]  [facility] [address], [address] [place], [place]  [address] Phone - [phone]   Fax - [phone]  [facility]  [address] [place], [place]  [address] Phone - [phone]   Fax - [phone]  [facility] AT [facility] [address] [place], [address] [place], [place]  [address] Phone - [phone]   Fax - [phone]  [facility] AT [facility] [address] [place], [place]  [address] Phone - [phone]   Fax - [phone] [facility] [place] [address] [place], [place]  [address] Phone - [phone]   Fax - [phone]  [facility] [address] [place], [place]  [address] Phone - [phone]   Fax - [phone]  [facility] [address], [address] [place], [place]  [address] Phone - [phone]   Fax - [phone]  [facility] [address] [place], [place]  [address] Phone - [phone]   Fax - [phone]  [facility] [address], [address] [place], [place]  [address] Phone - [phone]  [facility] [address] [place], [place]  [address] Phone - [phone]   Fax - [phone]  [facility] [address] [place], [place]  [address] Phone - [phone]   Fax - [phone]  [facility] [address] [place], [place]  [address] Phone - [phone]   Fax - [phone]  [facility] [address] [place], [place]  [address] Phone - [phone]   Fax - [phone]   [facility] [address] [place], [place]  [address] Phone - [phone]   Fax - [phone]  [facility] [address] [place], [place]  [address] Phone - [phone]   Fax - [phone]  DAVID RUBIN [address] [place], [place]  [address] Phone - [phone]   Fax - [phone]  [facility] [address] [place], [place]  [address] Phone - [phone]   Fax - [phone]  [place] [facility] Alita Chyle [address] [place], [place]  [address] Phone - [phone]   Fax - [phone] Gerarda Fraction [address] [place], [place]  [address] Phone - [phone]   Fax - [phone]  [facility] [address] [place], [place]  [address] Phone - [phone]   Fax - [phone]  [facility] - [place] [address] [place], [place]  [address] Phone - [phone]   Fax - [phone]  [facility] [place] Wyvonne Lenz MD [address] [place] [address] Phone [phone]  Fax [phone]  Vaginal Delivery Vaginal delivery means that you will give birth by pushing your baby out  of your birth canal (vagina). A team of health care providers will help you before, during, and after vaginal delivery. Birth experiences are unique for every woman and every pregnancy, and birth experiences vary depending on where you choose to give birth. What should I do to prepare for my baby's birth? Before your baby is born, it is important to talk with your health care provider about:  Your  labor and delivery preferences. These may include:  Medicines that you may be given.  How you will manage your pain. This might include non-medical pain relief techniques or injectable pain relief such as epidural analgesia.  How you and your baby will be monitored during labor and delivery.  Who may be in the labor and delivery room with you.  Your feelings about surgical delivery of your baby (cesarean delivery, or C-section) if this becomes necessary.  Your feelings about receiving donated blood through an IV tube (blood transfusion) if this becomes necessary.  Whether you are able:  To take pictures or videos of the birth.  To eat during labor and delivery.  To move around, walk, or change positions during labor and delivery.  What to expect after your baby is born, such as:  Whether delayed umbilical cord clamping and cutting is offered.  Who will care for your baby right after birth.  Medicines or tests that may be recommended for your baby.  Whether breastfeeding is supported in your hospital or birth center.  How long you will be in the hospital or birth center.  How any medical conditions you have may affect your baby or your labor and delivery experience. To prepare for your baby's birth, you should also:  Attend all of your health care visits before delivery (prenatal visits) as recommended by your health care provider. This is important.  Prepare your home for your baby's arrival. Make sure that you have:  Diapers.  Baby clothing.  Feeding equipment.  Safe sleeping arrangements for you and your baby.  Install a car seat in your vehicle. Have your car seat checked by a certified car seat installer to make sure that it is installed safely.  Think about who will help you with your new baby at home for at least the first several weeks after delivery. What can I expect when I arrive at the birth center or hospital? Once you are in labor and have been  admitted into the hospital or birth center, your health care provider may:  Review your pregnancy history and any concerns you have.  Insert an IV tube into one of your veins. This is used to give you fluids and medicines.  Check your blood pressure, pulse, temperature, and heart rate (vital signs).  Check whether your bag of water (amniotic sac) has broken (ruptured).  Talk with you about your birth plan and discuss pain control options. Monitoring  Your health care provider may monitor your contractions (uterine monitoring) and your baby's heart rate (fetal monitoring). You may need to be monitored:  Often, but not continuously (intermittently).  All the time or for long periods at a time (continuously). Continuous monitoring may be needed if:  You are taking certain medicines, such as medicine to relieve pain or make your contractions stronger.  You have pregnancy or labor complications. Monitoring may be done by:  Placing a special stethoscope or a handheld monitoring device on your abdomen to check your baby's heartbeat, and feeling your abdomen for contractions. This method of monitoring does not continuously  record your baby's heartbeat or your contractions.  Placing monitors on your abdomen (external monitors) to record your baby's heartbeat and the frequency and length of contractions. You may not have to wear external monitors all the time.  Placing monitors inside of your uterus (internal monitors) to record your baby's heartbeat and the frequency, length, and strength of your contractions.  Your health care provider may use internal monitors if he or she needs more information about the strength of your contractions or your baby's heart rate.  Internal monitors are put in place by passing a thin, flexible wire through your vagina and into your uterus. Depending on the type of monitor, it may remain in your uterus or on your baby's head until birth.  Your health care  provider will discuss the benefits and risks of internal monitoring with you and will ask for your permission before inserting the monitors.  Telemetry. This is a type of continuous monitoring that can be done with external or internal monitors. Instead of having to stay in bed, you are able to move around during telemetry. Ask your health care provider if telemetry is an option for you. Physical exam  Your health care provider may perform a physical exam. This may include:  Checking whether your baby is positioned:  With the head toward your vagina (head-down). This is most common.  With the head toward the top of your uterus (head-up or breech). If your baby is in a breech position, your health care provider may try to turn your baby to a head-down position so you can deliver vaginally. If it does not seem that your baby can be born vaginally, your provider may recommend surgery to deliver your baby. In rare cases, you may be able to deliver vaginally if your baby is head-up (breech delivery).  Lying sideways (transverse). Babies that are lying sideways cannot be delivered vaginally.  Checking your cervix to determine:  Whether it is thinning out (effacing).  Whether it is opening up (dilating).  How low your baby has moved into your birth canal. What are the three stages of labor and delivery?   Normal labor and delivery is divided into the following three stages: Stage 1   Stage 1 is the longest stage of labor, and it can last for hours or days. Stage 1 includes:  Early labor. This is when contractions may be irregular, or regular and mild. Generally, early labor contractions are more than 10 minutes apart.  Active labor. This is when contractions get longer, more regular, more frequent, and more intense.  The transition phase. This is when contractions happen very close together, are very intense, and may last longer than during any other part of labor.  Contractions generally  feel mild, infrequent, and irregular at first. They get stronger, more frequent (about every 2-3 minutes), and more regular as you progress from early labor through active labor and transition.  Many women progress through stage 1 naturally, but you may need help to continue making progress. If this happens, your health care provider may talk with you about:  Rupturing your amniotic sac if it has not ruptured yet.  Giving you medicine to help make your contractions stronger and more frequent.  Stage 1 ends when your cervix is completely dilated to 4 inches (10 cm) and completely effaced. This happens at the end of the transition phase. Stage 2   Once your cervix is completely effaced and dilated to 4 inches (10 cm), you may start  to feel an urge to push. It is common for the body to naturally take a rest before feeling the urge to push, especially if you received an epidural or certain other pain medicines. This rest period may last for up to 1-2 hours, depending on your unique labor experience.  During stage 2, contractions are generally less painful, because pushing helps relieve contraction pain. Instead of contraction pain, you may feel stretching and burning pain, especially when the widest part of your baby's head passes through the vaginal opening (crowning).  Your health care provider will closely monitor your pushing progress and your baby's progress through the vagina during stage 2.  Your health care provider may massage the area of skin between your vaginal opening and anus (perineum) or apply warm compresses to your perineum. This helps it stretch as the baby's head starts to crown, which can help prevent perineal tearing.  In some cases, an incision may be made in your perineum (episiotomy) to allow the baby to pass through the vaginal opening. An episiotomy helps to make the opening of the vagina larger to allow more room for the baby to fit through.  It is very important to  breathe and focus so your health care provider can control the delivery of your baby's head. Your health care provider may have you decrease the intensity of your pushing, to help prevent perineal tearing.  After delivery of your baby's head, the shoulders and the rest of the body generally deliver very quickly and without difficulty.  Once your baby is delivered, the umbilical cord may be cut right away, or this may be delayed for 1-2 minutes, depending on your baby's health. This may vary among health care providers, hospitals, and birth centers.  If you and your baby are healthy enough, your baby may be placed on your chest or abdomen to help maintain the baby's temperature and to help you bond with each other. Some mothers and babies start breastfeeding at this time. Your health care team will dry your baby and help keep your baby warm during this time.  Your baby may need immediate care if he or she:  Showed signs of distress during labor.  Has a medical condition.  Was born too early (prematurely).  Had a bowel movement before birth (meconium).  Shows signs of difficulty transitioning from being inside the uterus to being outside of the uterus. If you are planning to breastfeed, your health care team will help you begin a feeding. Stage 3   The third stage of labor starts immediately after the birth of your baby and ends after you deliver the placenta. The placenta is an organ that develops during pregnancy to provide oxygen and nutrients to your baby in the womb.  Delivering the placenta may require some pushing, and you may have mild contractions. Breastfeeding can stimulate contractions to help you deliver the placenta.  After the placenta is delivered, your uterus should tighten (contract) and become firm. This helps to stop bleeding in your uterus. To help your uterus contract and to control bleeding, your health care provider may:  Give you medicine by injection, through an IV  tube, by mouth, or through your rectum (rectally).  Massage your abdomen or perform a vaginal exam to remove any blood clots that are left in your uterus.  Empty your bladder by placing a thin, flexible tube (catheter) into your bladder.  Encourage you to breastfeed your baby. After labor is over, you and your baby will  be monitored closely to ensure that you are both healthy until you are ready to go home. Your health care team will teach you how to care for yourself and your baby. This information is not intended to replace advice given to you by your health care provider. Make sure you discuss any questions you have with your health care provider. Document Released: 03/24/2008 Document Revised: 01/03/2016 Document Reviewed: 06/30/2015 Elsevier Interactive Patient Education  2017 Elsevier Inc[employer]  Nonstress Test The nonstress test is a procedure that monitors the fetus's heartbeat. The test will monitor the heartbeat when the fetus is at rest and while the fetus is moving. In a healthy fetus, there will be an increase in fetal heart rate when the fetus moves or kicks. The heart rate will decrease at rest. This test helps determine if the fetus is healthy. Your health care provider will look at a number of patterns in the heart rate tracing to make sure your baby is thriving. If there is concern, your health care provider may order additional tests or may suggest another course of action. This test is often done in the third trimester and can help determine if an early delivery is needed and safe. Common reasons to have this test are:  You are past your due date.  You have a high-risk pregnancy.  You are feeling less movement than normal.  You have lost a pregnancy in the past.  Your health care provider suspects fetal growth problems.  You have too much or too little amniotic fluid. What happens before the procedure?  Eat a meal right before the test or as directed by your health care  provider. Food may help stimulate fetal movements.  Use the restroom right before the test. What happens during the procedure?  Two belts will be placed around your abdomen. These belts have monitors attached to them. One records the fetal heart rate and the other records uterine contractions.  You may be asked to lie down on your side or to stay sitting upright.  You may be given a button to press when you feel movement.  The fetal heartbeat is listened to and watched on a screen. The heartbeat is recorded on a sheet of paper.  If the fetus seems to be sleeping, you may be asked to drink some juice or soda, gently press your abdomen, or make some noise to wake the fetus. What happens after the procedure? Your health care provider will discuss the test results with you and make recommendations for the near future.  This information is not intended to replace advice given to you by your health care provider. Make sure you discuss any questions you have with your health care provider. This information is not intended to replace advice given to you by your health care provider. Make sure you discuss any questions you have with your health care provider. Document Released: 06/05/2002 Document Revised: 05/15/2016 Document Reviewed: 07/19/2012 Elsevier Interactive Patient Education  2017 [employer].

## 2016-11-18 NOTE — Progress Notes (Signed)
   PRENATAL VISIT NOTE  Subjective:  Kaitlyn Frank is a 21 y.o. G1P0000 at 594w5d being seen today for ongoing prenatal care.  She is currently monitored for the following issues for this low-risk pregnancy and has Supervision of low-risk first pregnancy; Late prenatal care affecting pregnancy in third trimester; and Anemia affecting pregnancy in third trimester on her problem list.  Patient reports occasional contractions.  Contractions: Irregular. Vag. Bleeding: None.  Movement: Present. Denies leaking of fluid.   The following portions of the patient's history were reviewed and updated as appropriate: allergies, current medications, past family history, past medical history, past social history, past surgical history and problem list. Problem list updated.  Objective:   Vitals:   11/18/16 0827  BP: 109/68  Pulse: 78  Weight: 125 lb 8 oz (56.9 kg)    Fetal Status: Fetal Heart Rate (bpm): 125   Movement: Present     General:  Alert, oriented and cooperative. Patient is in no acute distress.  Skin: Skin is warm and dry. No rash noted.   Cardiovascular: Normal heart rate noted  Respiratory: Normal respiratory effort, no problems with respiration noted  Abdomen: Soft, gravid, appropriate for gestational age. Pain/Pressure: Present     Pelvic:  Cervical exam deferred        Extremities: Normal range of motion.  Edema: None  Mental Status: Normal mood and affect. Normal behavior. Normal judgment and thought content.   Assessment and Plan:  Pregnancy: G1P0000 at 844w5d  1. Encounter for supervision of low-risk first pregnancy in third trimester  - Prenatal Vit-Fe Fumarate-FA (MULTIVITAMIN-PRENATAL) 27-0.8 MG TABS tablet; Take 1 tablet by mouth daily at 12 noon.  Dispense: 30 each; Refill: 12  2. Late prenatal care affecting pregnancy in third trimester     Plan NST for Monday then IOL for post dates on Jun 1  3. Anemia affecting pregnancy in third trimester   Term labor  symptoms and general obstetric precautions including but not limited to vaginal bleeding, contractions, leaking of fluid and fetal movement were reviewed in detail with the patient. Please refer to After Visit Summary for other counseling recommendations.  Return in about 5 days (around 11/23/2016) for NST for PostDates.   Wynelle BourgeoisMarie Krystyl Cannell, CNM

## 2016-11-20 ENCOUNTER — Inpatient Hospital Stay (HOSPITAL_COMMUNITY)
Admission: AD | Admit: 2016-11-20 | Discharge: 2016-11-22 | DRG: 775 | Disposition: A | Discharge status: Home / Self Care | Payer: Medicaid Other | Source: Ambulatory Visit | Attending: Obstetrics & Gynecology | Admitting: Obstetrics & Gynecology

## 2016-11-20 ENCOUNTER — Inpatient Hospital Stay (HOSPITAL_COMMUNITY): Payer: Medicaid Other | Admitting: Anesthesiology

## 2016-11-20 ENCOUNTER — Encounter (HOSPITAL_COMMUNITY): Payer: Self-pay | Admitting: *Deleted

## 2016-11-20 DIAGNOSIS — O9902 Anemia complicating childbirth: Secondary | ICD-10-CM | POA: Diagnosis present

## 2016-11-20 DIAGNOSIS — O99824 Streptococcus B carrier state complicating childbirth: Principal | ICD-10-CM | POA: Diagnosis present

## 2016-11-20 DIAGNOSIS — D509 Iron deficiency anemia, unspecified: Secondary | ICD-10-CM | POA: Diagnosis present

## 2016-11-20 DIAGNOSIS — Z3493 Encounter for supervision of normal pregnancy, unspecified, third trimester: Secondary | ICD-10-CM | POA: Diagnosis present

## 2016-11-20 DIAGNOSIS — Z833 Family history of diabetes mellitus: Secondary | ICD-10-CM

## 2016-11-20 DIAGNOSIS — O4290 Premature rupture of membranes, unspecified as to length of time between rupture and onset of labor, unspecified weeks of gestation: Secondary | ICD-10-CM

## 2016-11-20 DIAGNOSIS — Z3A40 40 weeks gestation of pregnancy: Secondary | ICD-10-CM

## 2016-11-20 DIAGNOSIS — B951 Streptococcus, group B, as the cause of diseases classified elsewhere: Secondary | ICD-10-CM

## 2016-11-20 LAB — CBC
HCT: 36.8 % (ref 36.0–46.0)
Hemoglobin: 12.2 g/dL (ref 12.0–15.0)
MCH: 29.3 pg (ref 26.0–34.0)
MCHC: 33.2 g/dL (ref 30.0–36.0)
MCV: 88.2 fL (ref 78.0–100.0)
PLATELETS: 250 10*3/uL (ref 150–400)
RBC: 4.17 MIL/uL (ref 3.87–5.11)
RDW: 18.9 % — ABNORMAL HIGH (ref 11.5–15.5)
WBC: 11 10*3/uL — ABNORMAL HIGH (ref 4.0–10.5)

## 2016-11-20 LAB — TYPE AND SCREEN
ABO/RH(D): O POS
Antibody Screen: NEGATIVE

## 2016-11-20 LAB — RPR: RPR: NONREACTIVE

## 2016-11-20 LAB — ABO/RH: ABO/RH(D): O POS

## 2016-11-20 MED ORDER — SENNOSIDES-DOCUSATE SODIUM 8.6-50 MG PO TABS
2.0000 | ORAL_TABLET | ORAL | Status: DC
  Administered 2016-11-20 – 2016-11-21 (×2): 2 via ORAL
  Filled 2016-11-20 (×2): qty 2

## 2016-11-20 MED ORDER — DIPHENHYDRAMINE HCL 25 MG PO CAPS: 25.0000 mg | ORAL_CAPSULE | Freq: Four times a day (QID) | ORAL | Status: DC | PRN

## 2016-11-20 MED ORDER — WITCH HAZEL-GLYCERIN EX PADS: 1.0000 "application " | MEDICATED_PAD | CUTANEOUS | Status: DC | PRN

## 2016-11-20 MED ORDER — ACETAMINOPHEN 325 MG PO TABS: 650.0000 mg | ORAL_TABLET | ORAL | Status: DC | PRN

## 2016-11-20 MED ORDER — OXYCODONE-ACETAMINOPHEN 5-325 MG PO TABS: 2.0000 | ORAL_TABLET | ORAL | Status: DC | PRN

## 2016-11-20 MED ORDER — OXYTOCIN 40 UNITS IN LACTATED RINGERS INFUSION - SIMPLE MED
2.5000 [IU]/h | INTRAVENOUS | Status: DC
  Filled 2016-11-20: qty 1000

## 2016-11-20 MED ORDER — FERROUS SULFATE 325 (65 FE) MG PO TABS
325.0000 mg | ORAL_TABLET | Freq: Two times a day (BID) | ORAL | Status: DC
  Administered 2016-11-20 – 2016-11-22 (×4): 325 mg via ORAL
  Filled 2016-11-20 (×4): qty 1

## 2016-11-20 MED ORDER — PENICILLIN G POT IN DEXTROSE 60000 UNIT/ML IV SOLN
3.0000 10*6.[IU] | INTRAVENOUS | Status: DC
  Filled 2016-11-20 (×4): qty 50

## 2016-11-20 MED ORDER — EPHEDRINE 5 MG/ML INJ
10.0000 mg | INTRAVENOUS | Status: DC | PRN
  Filled 2016-11-20: qty 2

## 2016-11-20 MED ORDER — LACTATED RINGERS IV SOLN
500.0000 mL | Freq: Once | INTRAVENOUS | Status: AC
  Administered 2016-11-20: 500 mL via INTRAVENOUS

## 2016-11-20 MED ORDER — SOD CITRATE-CITRIC ACID 500-334 MG/5ML PO SOLN: 30.0000 mL | ORAL | Status: DC | PRN

## 2016-11-20 MED ORDER — PENICILLIN G POTASSIUM 5000000 UNITS IJ SOLR
5.0000 10*6.[IU] | Freq: Once | INTRAVENOUS | Status: AC
  Administered 2016-11-20: 5 10*6.[IU] via INTRAVENOUS
  Filled 2016-11-20: qty 5

## 2016-11-20 MED ORDER — FLEET ENEMA 7-19 GM/118ML RE ENEM: 1.0000 | ENEMA | RECTAL | Status: DC | PRN

## 2016-11-20 MED ORDER — FENTANYL 2.5 MCG/ML BUPIVACAINE 1/10 % EPIDURAL INFUSION (WH - ANES)
14.0000 mL/h | INTRAMUSCULAR | Status: DC | PRN
  Administered 2016-11-20: 14 mL/h via EPIDURAL
  Filled 2016-11-20: qty 100

## 2016-11-20 MED ORDER — PRENATAL MULTIVITAMIN CH
1.0000 | ORAL_TABLET | Freq: Every day | ORAL | Status: DC
  Administered 2016-11-21: 1 via ORAL
  Filled 2016-11-20 (×4): qty 1

## 2016-11-20 MED ORDER — SIMETHICONE 80 MG PO CHEW: 80.0000 mg | CHEWABLE_TABLET | ORAL | Status: DC | PRN

## 2016-11-20 MED ORDER — ONDANSETRON HCL 4 MG PO TABS: 4.0000 mg | ORAL_TABLET | ORAL | Status: DC | PRN

## 2016-11-20 MED ORDER — LACTATED RINGERS IV SOLN: 500.0000 mL | INTRAVENOUS | Status: DC | PRN

## 2016-11-20 MED ORDER — DIBUCAINE 1 % RE OINT: 1.0000 "application " | TOPICAL_OINTMENT | RECTAL | Status: DC | PRN

## 2016-11-20 MED ORDER — ONDANSETRON HCL 4 MG/2ML IJ SOLN: 4.0000 mg | Freq: Four times a day (QID) | INTRAMUSCULAR | Status: DC | PRN

## 2016-11-20 MED ORDER — BENZOCAINE-MENTHOL 20-0.5 % EX AERO
1.0000 "application " | INHALATION_SPRAY | CUTANEOUS | Status: DC | PRN
  Administered 2016-11-20: 1 via TOPICAL
  Filled 2016-11-20: qty 56

## 2016-11-20 MED ORDER — TETANUS-DIPHTH-ACELL PERTUSSIS 5-2.5-18.5 LF-MCG/0.5 IM SUSP: 0.5000 mL | Freq: Once | INTRAMUSCULAR | Status: DC

## 2016-11-20 MED ORDER — LIDOCAINE HCL (PF) 1 % IJ SOLN
INTRAMUSCULAR | Status: DC | PRN
  Administered 2016-11-20: 6 mL
  Administered 2016-11-20: 6 mL via EPIDURAL

## 2016-11-20 MED ORDER — IBUPROFEN 600 MG PO TABS
600.0000 mg | ORAL_TABLET | Freq: Four times a day (QID) | ORAL | Status: DC
  Administered 2016-11-20 – 2016-11-22 (×8): 600 mg via ORAL
  Filled 2016-11-20 (×8): qty 1

## 2016-11-20 MED ORDER — ONDANSETRON HCL 4 MG/2ML IJ SOLN: 4.0000 mg | INTRAMUSCULAR | Status: DC | PRN

## 2016-11-20 MED ORDER — PHENYLEPHRINE 40 MCG/ML (10ML) SYRINGE FOR IV PUSH (FOR BLOOD PRESSURE SUPPORT)
80.0000 ug | PREFILLED_SYRINGE | INTRAVENOUS | Status: DC | PRN
  Filled 2016-11-20: qty 5
  Filled 2016-11-20: qty 10

## 2016-11-20 MED ORDER — LIDOCAINE HCL (PF) 1 % IJ SOLN
30.0000 mL | INTRAMUSCULAR | Status: DC | PRN
  Administered 2016-11-20: 30 mL via SUBCUTANEOUS
  Filled 2016-11-20: qty 30

## 2016-11-20 MED ORDER — ZOLPIDEM TARTRATE 5 MG PO TABS: 5.0000 mg | ORAL_TABLET | Freq: Every evening | ORAL | Status: DC | PRN

## 2016-11-20 MED ORDER — COCONUT OIL OIL: 1.0000 "application " | TOPICAL_OIL | Status: DC | PRN

## 2016-11-20 MED ORDER — PHENYLEPHRINE 40 MCG/ML (10ML) SYRINGE FOR IV PUSH (FOR BLOOD PRESSURE SUPPORT)
80.0000 ug | PREFILLED_SYRINGE | INTRAVENOUS | Status: DC | PRN
  Filled 2016-11-20: qty 5

## 2016-11-20 MED ORDER — LACTATED RINGERS IV SOLN
INTRAVENOUS | Status: DC
  Administered 2016-11-20: 08:00:00 via INTRAVENOUS

## 2016-11-20 MED ORDER — OXYCODONE-ACETAMINOPHEN 5-325 MG PO TABS: 1.0000 | ORAL_TABLET | ORAL | Status: DC | PRN

## 2016-11-20 MED ORDER — DIPHENHYDRAMINE HCL 50 MG/ML IJ SOLN: 12.5000 mg | INTRAMUSCULAR | Status: DC | PRN

## 2016-11-20 MED ORDER — OXYTOCIN BOLUS FROM INFUSION
500.0000 mL | Freq: Once | INTRAVENOUS | Status: AC
  Administered 2016-11-20: 500 mL via INTRAVENOUS

## 2016-11-20 NOTE — H&P (Signed)
Kaitlyn Frank is a 21 y.o. female G1P0000 with IUP at [redacted]w[redacted]d dated by ultrasound presenting for labor. Pt states she has been having regular contractions, every 2-5 minutes since 4 am this morning. Denies vaginal bleeding. Positive fetal movement. During her MAU visit her membranes spontaneously ruptured & she made cervical change from 2 to 3 cm. Received prenatal care in [place] prior to moving here. Has been receiving prenatal care at [facility] since 34 weeks. GBS positive. Unsure of pain control measures. Plans on IUD for contraception.   Patient Active Problem List   Diagnosis Date Noted  . Anemia affecting pregnancy in third trimester 10/18/2016  . Supervision of low-risk first pregnancy 10/15/2016  . Late prenatal care affecting pregnancy in third trimester 10/15/2016    Prenatal History/Complications:  Past Medical History: Past Medical History:  Diagnosis Date  . Medical history non-contributory     Past Surgical History: Past Surgical History:  Procedure Laterality Date  . NO PAST SURGERIES      Obstetrical History: OB History    Gravida Para Term Preterm AB Living   1 0 0 0 0 0   SAB TAB Ectopic Multiple Live Births   0 0 0 0 0      Gynecological History: OB History    Gravida Para Term Preterm AB Living   1 0 0 0 0 0   SAB TAB Ectopic Multiple Live Births   0 0 0 0 0      Social History: Social History   Social History  . Marital status: Unknown    Spouse name: N/A  . Number of children: N/A  . Years of education: N/A   Social History Main Topics  . Smoking status: Never Smoker  . Smokeless tobacco: Never Used  . Alcohol use No  . Drug use: No  . Sexual activity: Not Currently   Other Topics Concern  . None   Social History Narrative  . None    Family History: Family History  Problem Relation Age of Onset  . Diabetes Maternal Aunt     Allergies: No Known Allergies  Prescriptions Prior to Admission  Medication Sig Dispense Refill  Last Dose  . ferrous sulfate 325 (65 FE) MG tablet Take 325 mg by mouth 2 (two) times daily with a meal.   Taking  . Prenatal Vit-Fe Fumarate-FA (MULTIVITAMIN-PRENATAL) 27-0.8 MG TABS tablet Take 1 tablet by mouth daily at 12 noon. 30 each 12     Review of Systems - Negative except abdominal pain & LOF  Blood pressure 110/68, pulse 84, temperature 98.1 F (36.7 C), temperature source Oral, resp. rate 18, height 5\' 3"  (1.6 m), weight 106 lb (48.1 kg), last menstrual period 02/17/2016. Physical Exam  BP 110/68 (BP Location: Right Arm)   Pulse 84   Temp 98.1 F (36.7 C) (Oral)   Resp 18   Ht 5\' 3"  (1.6 m)   Wt 106 lb (48.1 kg)   LMP 02/17/2016   BMI 18.78 kg/m  General appearance: alert, cooperative and moderate distress Head: Normocephalic, without obvious abnormality, atraumatic Lungs: clear to auscultation bilaterally Heart: regular rate and rhythm, S1, S2 normal, no murmur, click, rub or gallop Abdomen: moderate ctx palpated, BSx4   Dilation: 3 Effacement (%): 90 Station: -1 Exam by:: Laurell Josephs RN  Fetal Tracing:  Baseline: 130 Variability: moderate Accelerations: 15x15 Decelerations: none  Toco: Q3-5 mins, palpate moderate  Prenatal Transfer Tool  Maternal Diabetes: No Genetic Screening: Declined Maternal Ultrasounds/Referrals: Normal Fetal  Ultrasounds or other Referrals:  None Maternal Substance Abuse:  No Significant Maternal Medications:  None Significant Maternal Lab Results: Lab values include: Group B Strep positive     Assessment: 1. Active labor & SROM 2. Fetal Wellbeing: Category 1  3. Pain Control: undecided 4. GBS: positive 5. 40 week IUP  Plan:  1. Admit to BS 2. Routine L&D orders 3. Analgesia/anesthesia PRN  4. PCN protocol for GBS   Judeth HornLawrence, Tandi Hanko, NP 11/20/2016, 8:24 AM

## 2016-11-20 NOTE — Progress Notes (Signed)
Labor Progress Note Kaitlyn Frank is a 21 y.o. G1P0000 at 250w0d presented for term labor and SROM.   S: S/p epidural which initially was very helpful for pain control. Pain and pressure intensifying. Pressure is not constant and she denies urge to push.   O:  BP 113/67   Pulse 98   Temp 98.5 F (36.9 C) (Oral)   Resp 18   Ht 5\' 3"  (1.6 m)   Wt 48.1 kg (106 lb)   LMP 02/17/2016   SpO2 100%   BMI 18.78 kg/m  EFM: 130/mod var/acc/no decels  CVE: Dilation: 10 Dilation Complete Date: 11/20/16 Dilation Complete Time: 1020 Effacement (%): 100 Station: 0 Presentation: Vertex Exam by:: Kaitlyn Frank   A&P: 21 y.o. G1P0000 2150w0d  #Labor: Progressing well. Will labor down.  #Pain: Epidural.  #FWB: Category I. Continuous fetal monitoring.  #GBS positive, PCN ppx  #Iron Deficiency Anemia: Stable H/H on presentation.   Al CorpusMatthew R Kira Hartl, MD 10:27 AM

## 2016-11-20 NOTE — Progress Notes (Signed)
UR chart review completed.  

## 2016-11-20 NOTE — Anesthesia Preprocedure Evaluation (Signed)
Anesthesia Evaluation  Patient identified by MRN, date of birth, ID band Patient awake    Reviewed: Allergy & Precautions, H&P , NPO status , Patient's Chart, lab work & pertinent test results  Airway Mallampati: I  TM Distance: >3 FB Neck ROM: full    Dental no notable dental hx.    Pulmonary neg pulmonary ROS,    Pulmonary exam normal breath sounds clear to auscultation       Cardiovascular negative cardio ROS Normal cardiovascular exam Rhythm:regular Rate:Normal     Neuro/Psych negative neurological ROS  negative psych ROS   GI/Hepatic negative GI ROS, Neg liver ROS,   Endo/Other  negative endocrine ROS  Renal/GU negative Renal ROS  negative genitourinary   Musculoskeletal negative musculoskeletal ROS (+)   Abdominal Normal abdominal exam  (+)   Peds negative pediatric ROS (+)  Hematology negative hematology ROS (+)   Anesthesia Other Findings   Reproductive/Obstetrics (+) Pregnancy                             Anesthesia Physical Anesthesia Plan  ASA: II  Anesthesia Plan: Epidural   Post-op Pain Management:    Induction:   Airway Management Planned:   Additional Equipment:   Intra-op Plan:   Post-operative Plan:   Informed Consent: I have reviewed the patients History and Physical, chart, labs and discussed the procedure including the risks, benefits and alternatives for the proposed anesthesia with the patient or authorized representative who has indicated his/her understanding and acceptance.     Plan Discussed with:   Anesthesia Plan Comments:         Anesthesia Quick Evaluation

## 2016-11-20 NOTE — Anesthesia Procedure Notes (Signed)
Epidural Patient location during procedure: OB Start time: 11/20/2016 9:00 AM End time: 11/20/2016 9:03 AM  Staffing Anesthesiologist: Leilani AbleHATCHETT, Madeline Pho Performed: anesthesiologist   Preanesthetic Checklist Completed: patient identified, surgical consent, pre-op evaluation, timeout performed, IV checked, risks and benefits discussed and monitors and equipment checked  Epidural Patient position: sitting Prep: site prepped and draped and DuraPrep Patient monitoring: continuous pulse ox and blood pressure Approach: midline Location: L3-L4 Injection technique: LOR air  Needle:  Needle type: Tuohy  Needle gauge: 17 G Needle length: 9 cm and 9 Needle insertion depth: 4 cm Catheter type: closed end flexible Catheter size: 19 Gauge Catheter at skin depth: 9 cm Test dose: negative and Other  Assessment Sensory level: T9 Events: blood not aspirated, injection not painful, no injection resistance, negative IV test and no paresthesia  Additional Notes Reason for block:procedure for pain

## 2016-11-20 NOTE — Anesthesia Pain Management Evaluation Note (Signed)
  CRNA Pain Management Visit Note  Patient: Kaitlyn Frank, 21 y.o., female  "Hello I am a member of the anesthesia team at [facility]. We have an anesthesia team available at all times to provide care throughout the hospital, including epidural management and anesthesia for C-section. I don't know your plan for the delivery whether it a natural birth, water birth, IV sedation, nitrous supplementation, doula or epidural, but we want to meet your pain goals."   1.Was your pain managed to your expectations on prior hospitalizations?   No prior hospitalizations  2.What is your expectation for pain management during this hospitalization?     Epidural  3.How can we help you reach that goal?   Record the patient's initial score and the patient's pain goal.   Pain: 6  Pain Goal: 4 [facility] wants you to be able to say your pain was always managed very well.  Kaitlyn Frank,Kaitlyn Frank 11/20/2016

## 2016-11-20 NOTE — MAU Note (Signed)
PT  SAYS [facility]  [facility]  [facility].   VE IN CLINIC- UNSURE.  DENIES HSV AND  MRSA.  GBS- POSITIVE

## 2016-11-20 NOTE — Lactation Note (Signed)
This note was copied from a baby's chart. Lactation Consultation Note  Patient Name: Kaitlyn Frank AOZHY'QToday's Date: 11/20/2016 Reason for consult: Initial assessment   Initial assessment with first time mom on PPU. Mom reports infant fed twice since birth. Mom reports + breast changes with pregnancy. Infant being examined by RN, she was rooting and cueing to feed.   RN Assisted mom in latching infant to breast. Adjustments made by adding pillow and aligning infant at the breast. Enc. Mom to feed infant STS 8-12 x in 24 hours at first feeding cues using pillow and head support. Enc mom to use both breast with each feeding. Enc mom to massage/conpress breast with feeding. Showed mom how to hand express, colostrum easily expressible from left breast, infant was on the right breast.   Mom with large compressible breasts and areola with short shaft everted nipples. Mom's breasts tender with latch and hand expression.   BF Resources Handout and LC Brochure given, mom informed of IP/OP Services, BF Support Groups and LC phone #. [facility], she does not have a pump at home.   Mom without questions/concerns at this time. Enc mom to call out for feeding assistance as needed.    Maternal Data Formula Feeding for Exclusion: No Has patient been taught Hand Expression?: Yes Does the patient have breastfeeding experience prior to this delivery?: No  Feeding Feeding Type: Breast Fed Length of feed:  (infant still feeding when LC left room)  LATCH Score/Interventions Latch: Grasps breast easily, tongue down, lips flanged, rhythmical sucking.  Audible Swallowing: A few with stimulation Intervention(s): Skin to skin;Hand expression;Alternate breast massage  Type of Nipple: Everted at rest and after stimulation  Comfort (Breast/Nipple): Filling, red/small blisters or bruises, mild/mod discomfort  Problem noted: Mild/Moderate discomfort Interventions  (Filling):  (deepen latch)  Hold (Positioning): Assistance needed to correctly position infant at breast and maintain latch. Intervention(s): Breastfeeding basics reviewed;Support Pillows;Position options;Skin to skin  LATCH Score: 7  Lactation Tools Discussed/Used WIC Program: No (had pregnancy WIC, not sure if she will continue PP)   Consult Status Consult Status: Follow-up Date: 11/21/16 Follow-up type: In-patient    Silas FloodSharon S Adalaide Jaskolski 11/20/2016, 3:20 PM

## 2016-11-20 NOTE — Anesthesia Postprocedure Evaluation (Signed)
Anesthesia Post Note  Patient: Kaitlyn Frank  Procedure(s) Performed: * No procedures listed *  Patient location during evaluation: Mother Baby Anesthesia Type: Epidural Level of consciousness: awake and alert and oriented Pain management: satisfactory to patient Vital Signs Assessment: post-procedure vital signs reviewed and stable Respiratory status: spontaneous breathing and nonlabored ventilation Cardiovascular status: stable Postop Assessment: no headache, no backache, no signs of nausea or vomiting, adequate PO intake and patient able to bend at knees (patient up walking) Anesthetic complications: no        Last Vitals:  Vitals:   11/20/16 1342 11/20/16 1345  BP:    Pulse:  68  Resp:    Temp: 36.9 C     Last Pain:  Vitals:   11/20/16 1448  TempSrc:   PainSc: 0-No pain   Pain Goal:                 Shyenne Maggard

## 2016-11-21 MED ORDER — PRENATAL MULTIVITAMIN CH
1.0000 | ORAL_TABLET | Freq: Every day | ORAL | Status: DC
  Administered 2016-11-22: 1 via ORAL
  Filled 2016-11-21: qty 1

## 2016-11-21 NOTE — Lactation Note (Signed)
This note was copied from a baby's chart. Lactation Consultation Note  Patient Name: Kaitlyn Erskin BurnetMalaz Mohamed Nour WUJWJ'XToday's Date: 11/21/2016 Reason for consult: Follow-up assessment   Follow up with mom of 32 hour old infant. Infant with 10 BF for 10-25 minutes, 2 attempts, 3 voids, 2 stools, and 2 emesis in the last 24 hours. Infant weight 7 lb 1.9 oz with 2% weight loss since birth.   Mom reports she feels BF is going well. She reports her breasts are feeling fuller today. She reports nipple tenderness with latch that improves with feeding. She says she is experiencing pain towards the end of the feeding with a few feedings, enc mom to relatch as needed to maintain deep latch. Enc mom to apply EBM to nipples post BF.   Mom without further questions/concerns at this time. She reports she does not need assistance with feedings at this time. Enc mom to call out for feeding assistance as needed.    Maternal Data Formula Feeding for Exclusion: No Has patient been taught Hand Expression?: Yes Does the patient have breastfeeding experience prior to this delivery?: No  Feeding    LATCH Score/Interventions                      Lactation Tools Discussed/Used     Consult Status Consult Status: Follow-up Date: 11/22/16 Follow-up type: In-patient    Silas FloodSharon S Marti Acebo 11/21/2016, 8:18 PM

## 2016-11-21 NOTE — Progress Notes (Signed)
Post Partum Day #1 Subjective: no complaints, up ad lib and tolerating PO; breastfeeding going well; plans IUD for PP contraception  Objective: Blood pressure 96/67, pulse 68, temperature 98.1 F (36.7 C), temperature source Oral, resp. rate 18, height 5\' 3"  (1.6 m), weight 48.1 kg (106 lb), last menstrual period 02/17/2016, SpO2 100 %, unknown if currently breastfeeding.  Physical Exam:  General: alert, cooperative and no distress Lochia: appropriate Uterine Fundus: firm DVT Evaluation: No evidence of DVT seen on physical exam.   Recent Labs  11/20/16 0820  HGB 12.2  HCT 36.8    Assessment/Plan: Plan for discharge tomorrow   LOS: 1 day   Cam HaiSHAW, Quantae Martel CNM 11/21/2016, 9:11 AM

## 2016-11-22 MED ORDER — SENNOSIDES-DOCUSATE SODIUM 8.6-50 MG PO TABS: 1.0000 | ORAL_TABLET | Freq: Every evening | ORAL | 0 refills | Status: DC | PRN

## 2016-11-22 MED ORDER — IBUPROFEN 600 MG PO TABS: 600.0000 mg | ORAL_TABLET | Freq: Four times a day (QID) | ORAL | 0 refills | Status: DC

## 2016-11-22 NOTE — Discharge Instructions (Signed)

## 2016-11-22 NOTE — Lactation Note (Signed)
This note was copied from a baby's chart. Lactation Consultation Note  Patient Name: Kaitlyn Frank NWGNF'AToday's Date: 11/22/2016 Reason for consult: Follow-up assessment;Infant weight loss (6% weight loss, )  Baby is 50 hours old  Per mom baby last fed at 12:30 pm for 30 mins and has been sleeping since.  Per mom feels comfortable with latching , just some discomfort.  Per mom nipples are tender for a few minutes when latching and improves.  LC offered to assess breast tissue and mom contented - no breakdown noted.  Areolas with some edema , compressible, breast full , not engorged,  And warm. LC mentioned to mom her breast feel like the milk is in.  [facility] hand expressing , easily expressed, and mom repeated easily.  LC instructed mom on the use hand pump, #24 flange good fit today,  And #27 flange given for when the milk comes in.  Sore nipples and engorgement prevention and tx reviewed.  LC also instructed mom on the use of shells to prevent soreness while awake  Between feedings.  Mother informed of post-discharge support and given phone number to the lactation department, including services for phone call assistance; out-patient appointments; and breastfeeding support group. List of other breastfeeding resources in the community given in the handout. Encouraged mother to call for problems or concerns related to breastfeeding.   Maternal Data Has patient been taught Hand Expression?: Yes  Feeding Feeding Type:  (last fed at 1230 pm - for 30 mins per mom ) Length of feed: 30 min (per mom )  LATCH Score/Interventions                Intervention(s): Breastfeeding basics reviewed     Lactation Tools Discussed/Used Tools: Pump;Shells;Flanges Flange Size: 27 (#27 for when milk comes in ) Shell Type: Inverted Breast pump type: Manual (mom returned demo ) WIC Program: No Pump Review: Setup, frequency, and cleaning Initiated by:: MAI  Date initiated::  11/22/16   Consult Status Consult Status: Complete Date: 11/22/16    Kaitlyn Frank 11/22/2016, 2:30 PM

## 2016-11-22 NOTE — Progress Notes (Signed)
Post Partum Day 2  Subjective:  Kaitlyn Frank is a 21 y.o. G1P1001 5555w0d s/p NSVD.  No acute events overnight.  Pt denies problems with ambulating, voiding or po intake.  She denies nausea or vomiting.  Pain is well controlled.  She has had flatus. She has had bowel movement.  Lochia Minimal.  Plan for birth control is IUD.  Method of Feeding: breast feeding  Objective: BP 109/66 (BP Location: Right Arm)   Pulse 65   Temp 98.2 F (36.8 C) (Oral)   Resp 18   Ht 5\' 3"  (1.6 m)   Wt 48.1 kg (106 lb)   LMP 02/17/2016   SpO2 100%   Breastfeeding? Unknown   BMI 18.78 kg/m   Physical Exam:  General: alert, cooperative and no distress Chest: CTAB Heart: RRR no m/r/g Abdomen: soft, nontender Uterine Fundus: fundus firm below umbilicus DVT Evaluation: No evidence of DVT seen on physical exam. Extremities: no LE edema   Recent Labs  11/20/16 0820  HGB 12.2  HCT 36.8    Assessment/Plan:  ASSESSMENT: Kaitlyn Frank is a 21 y.o. G1P1001 4155w0d ppd #2 s/p NSVD doing well.   Discharge home   LOS: 2 days   Ivan AnchorsJohn Adeolu [facility] 11/22/2016, 8:55 AM

## 2016-11-22 NOTE — Discharge Summary (Signed)
OB Discharge Summary     Patient Name: Kaitlyn Frank DOB: 02-May-1996 MRN: [id number]  Date of admission: 11/20/2016 Delivering MD: Howard Pouch   Date of discharge: 11/22/2016  Admitting diagnosis: 40WKS,LABOR Intrauterine pregnancy: [redacted]w[redacted]d     Secondary diagnosis:  Active Problems:   Indication for care in labor and delivery, antepartum  Additional problems:  Patient Active Problem List   Diagnosis Date Noted  . Indication for care in labor and delivery, antepartum 11/20/2016  . Anemia affecting pregnancy in third trimester 10/18/2016  . Supervision of low-risk first pregnancy 10/15/2016  . Late prenatal care affecting pregnancy in third trimester 10/15/2016         Discharge diagnosis: Term Pregnancy Delivered                                                                                                Post partum procedures:None  Augmentation: None  Complications: None  Hospital course:  Onset of Labor With Vaginal Delivery     21 y.o. yo G1P1001 at [redacted]w[redacted]d was admitted in Active Labor on 11/20/2016. Patient had an uncomplicated labor course as follows:  Membrane Rupture Time/Date: 8:12 AM ,11/20/2016   Intrapartum Procedures: Episiotomy: None [1]                                         Lacerations:  2nd degree [3];Labial [10]  Patient had a delivery of a Viable infant. 11/20/2016  Information for the patient's newborn:  Saraya Tirey, Girl Avyonna [[id number]]  Delivery Method: Vag-Spont    Pateint had an uncomplicated postpartum course.  She is ambulating, tolerating a regular diet, passing flatus, and urinating well. Patient is discharged home in stable condition on 11/22/16.   Physical exam  Vitals:   11/21/16 0500 11/21/16 1713 11/22/16 0630 11/22/16 0900  BP: 96/67 104/74 109/66 (!) 90/42  Pulse: 68 (!) 57 65 68  Resp: 18 18 18 18   Temp: 98.1 F (36.7 C) 98.1 F (36.7 C) 98.2 F (36.8 C) 98.1 F (36.7 C)  TempSrc: Oral  Oral Oral  SpO2:       Weight:      Height:       Please see progress note from date of discharge for physical exam.   Labs: Lab Results  Component Value Date   WBC 11.0 (H) 11/20/2016   HGB 12.2 11/20/2016   HCT 36.8 11/20/2016   MCV 88.2 11/20/2016   PLT 250 11/20/2016   No flowsheet data found.  Discharge instruction: per After Visit Summary and "Baby and Me Booklet".  After visit meds:  Allergies as of 11/22/2016   No Known Allergies     Medication List    TAKE these medications   ferrous sulfate 325 (65 FE) MG tablet Take 325 mg by mouth 2 (two) times daily with a meal.   ibuprofen 600 MG tablet Commonly known as:  ADVIL,MOTRIN Take 1 tablet (600 mg total) by mouth every 6 (six) hours.   multivitamin-prenatal 27-0.8  MG Tabs tablet Take 1 tablet by mouth daily at 12 noon.   senna-docusate 8.6-50 MG tablet Commonly known as:  Senokot-S Take 1 tablet by mouth at bedtime as needed for mild constipation.       Diet: routine diet  Activity: Advance as tolerated. Pelvic rest for 6 weeks.   Outpatient follow up:6 weeks Follow up Appt: Future Appointments Date Time Provider Department Center  11/24/2016 12:40 PM Constant, Gigi GinPeggy, MD [facility]   Follow up Visit:No Follow-up on file.  Postpartum contraception: IUD at follow up  Newborn Data: Live born female  Birth Weight: 7 lb 4.2 oz (3295 g) APGAR: 9, 9  Baby Feeding: Breast Disposition:home with mother   11/22/2016 Howard PouchLauren Feng, MD

## 2016-11-24 ENCOUNTER — Other Ambulatory Visit: Payer: Medicaid Other | Admitting: Obstetrics and Gynecology

## 2016-11-24 ENCOUNTER — Encounter: Payer: Self-pay | Admitting: General Practice

## 2016-12-29 ENCOUNTER — Other Ambulatory Visit (HOSPITAL_COMMUNITY)
Admission: RE | Admit: 2016-12-29 | Discharge: 2016-12-29 | Disposition: A | Discharge status: Home / Self Care | Payer: Medicaid Other | Source: Ambulatory Visit | Attending: Medical | Admitting: Medical

## 2016-12-29 ENCOUNTER — Encounter: Payer: Self-pay | Admitting: Medical

## 2016-12-29 ENCOUNTER — Ambulatory Visit (INDEPENDENT_AMBULATORY_CARE_PROVIDER_SITE_OTHER): Payer: Medicaid Other | Admitting: Medical

## 2016-12-29 NOTE — Progress Notes (Signed)
Subjective:     Kaitlyn Frank is a 21 y.o. female who presents for a postpartum visit. She is 5 weeks postpartum following a spontaneous vaginal delivery. I have fully reviewed the prenatal and intrapartum course. The delivery was at 40 gestational weeks. Outcome: spontaneous vaginal delivery. Anesthesia: epidural. Postpartum course has been normal. Baby's course has been normal. Baby is feeding by breast. Bleeding staining only. Bowel function is normal. Bladder function is normal. Patient is not sexually active. Contraception method is none. Postpartum depression screening: negative.  The following portions of the patient's history were reviewed and updated as appropriate: allergies, current medications, past family history, past medical history, past social history, past surgical history and problem list.  Review of Systems Pertinent items are noted in HPI.   Objective:    BP 105/68   Pulse 77   Ht 5\' 3"  (1.6 m)   Wt 111 lb 12.8 oz (50.7 kg)   BMI 19.80 kg/m   General:  alert and cooperative   Breasts:  not performed  Lungs: clear to auscultation bilaterally  Heart:  regular rate and rhythm, S1, S2 normal, no murmur, click, rub or gallop  Abdomen: soft, non-tender; bowel sounds normal; no masses,  no organomegaly   Vulva:  normal  Vagina: normal vagina, no discharge, exudate, lesion, or erythema and scant bleeding  Cervix:  normal contour, no lesions  Corpus: normal size, contour, position, consistency, mobility, non-tender  Adnexa:  normal adnexa and no mass, fullness, tenderness  Rectal Exam: Not performed.        Assessment:     Normal postpartum exam. Pap smear done at today's visit.   Plan:    1. Contraception: abstinence and condoms 2. Patient will continue to read about IUD and other birth control options. Advised abstinence or condom use until birth control is initiated 3. Follow up in: 2-4 weeks for birth control initiation or sooner as needed.    Marny LowensteinWenzel,  Diannie Willner N, PA-C 12/29/2016 9:38 AM

## 2016-12-29 NOTE — Patient Instructions (Signed)
Intrauterine Device Information An intrauterine device (IUD) is inserted into your uterus to prevent pregnancy. There are two types of IUDs available:  Copper IUD-This type of IUD is wrapped in copper wire and is placed inside the uterus. Copper makes the uterus and fallopian tubes produce a fluid that kills sperm. The copper IUD can stay in place for 10 years.  Hormone IUD-This type of IUD contains the hormone progestin (synthetic progesterone). The hormone thickens the cervical mucus and prevents sperm from entering the uterus. It also thins the uterine lining to prevent implantation of a fertilized egg. The hormone can weaken or kill the sperm that get into the uterus. One type of hormone IUD can stay in place for 5 years, and another type can stay in place for 3 years.  Your health care provider will make sure you are a good candidate for a contraceptive IUD. Discuss with your health care provider the possible side effects. Advantages of an intrauterine device  IUDs are highly effective, reversible, long acting, and low maintenance.  There are no estrogen-related side effects.  An IUD can be used when breastfeeding.  IUDs are not associated with weight gain.  The copper IUD works immediately after insertion.  The hormone IUD works right away if inserted within 7 days of your period starting. You will need to use a backup method of birth control for 7 days if the hormone IUD is inserted at any other time in your cycle.  The copper IUD does not interfere with your female hormones.  The hormone IUD can make heavy menstrual periods lighter and decrease cramping.  The hormone IUD can be used for 3 or 5 years.  The copper IUD can be used for 10 years. Disadvantages of an intrauterine device  The hormone IUD can be associated with irregular bleeding patterns.  The copper IUD can make your menstrual flow heavier and more painful.  You may experience cramping and vaginal bleeding after  insertion. This information is not intended to replace advice given to you by your health care provider. Make sure you discuss any questions you have with your health care provider. Document Released: 05/19/2004 Document Revised: 11/21/2015 Document Reviewed: 12/04/2012 Elsevier Interactive Patient Education  2017 [employer]. Contraception Choices Contraception, also called birth control, means things to use or ways to try not to get pregnant. Hormonal birth control This kind of birth control uses hormones. Here are some types of hormonal birth control:  A tube that is put under skin of the arm (implant). The tube can stay in for as long as 3 years.  Shots to get every 3 months (injections).  Pills to take every day (birth control pills).  A patch to change 1 time each week for 3 weeks (birth control patch). After that, the patch is taken off for 1 week.  A ring to put in the vagina. The ring is left in for 3 weeks. Then it is taken out of the vagina for 1 week. Then a new ring is put in.  Pills to take after unprotected sex (emergency birth control pills).  Barrier birth control Here are some types of barrier birth control:  A thin covering that is put on the penis before sex (female condom). The covering is thrown away after sex.  A soft, loose covering that is put in the vagina before sex (female condom). The covering is thrown away after sex.  A rubber bowl that sits over the cervix (diaphragm). The bowl must be  made for you. The bowl is put into the vagina before sex. The bowl is left in for 6-8 hours after sex. It is taken out within 24 hours.  A small, soft cup that fits over the cervix (cervical cap). The cup must be made for you. The cup can be left in for 6-8 hours after sex. It is taken out within 48 hours.  A sponge that is put into the vagina before sex. It must be left in for at least 6 hours after sex. It must be taken out within 30 hours. Then it is thrown away.  A  chemical that kills or stops sperm from getting into the uterus (spermicide). It may be a pill, cream, jelly, or foam to put in the vagina. The chemical should be used at least 10-15 minutes before sex.  IUD (intrauterine) birth control An IUD is a small, T-shaped piece of plastic. It is put inside the uterus. There are two kinds:  Hormone IUD. This kind can stay in for 3-5 years.  Copper IUD. This kind can stay in for 10 years.  Permanent birth control Here are some types of permanent birth control:  Surgery to block the fallopian tubes.  Having an insert put into each fallopian tube.  Surgery to tie off the tubes that carry sperm (vasectomy).  Natural planning birth control Here are some types of natural planning birth control:  Not having sex on the days the woman could get pregnant.  Using a calendar: ? To keep track of the length of each period. ? To find out what days pregnancy can happen. ? To plan to not have sex on days when pregnancy can happen.  Watching for symptoms of ovulation and not having sex during ovulation. One way the woman can check for ovulation is to check her temperature.  Waiting to have sex until after ovulation.  Summary  Contraception, also called birth control, means things to use or ways to try not to get pregnant.  Hormonal methods of birth control include implants, injections, pills, patches, vaginal rings, and emergency birth control pills.  Barrier methods of birth control can include female condoms, female condoms, diaphragms, cervical caps, sponges, and spermicides.  There are two types of IUD (intrauterine device) birth control. An IUD can be put in a woman's uterus to prevent pregnancy for 3-5 years.  Permanent sterilization can be done through a procedure for males, females, or both.  Natural planning methods involve not having sex on the days when the woman could get pregnant. This information is not intended to replace advice given  to you by your health care provider. Make sure you discuss any questions you have with your health care provider. Document Released: 04/12/2009 Document Revised: 06/25/2016 Document Reviewed: 06/25/2016 Elsevier Interactive Patient Education  2017 [employer]

## 2016-12-31 LAB — CYTOLOGY - PAP: DIAGNOSIS: NEGATIVE

## 2017-01-28 ENCOUNTER — Ambulatory Visit (INDEPENDENT_AMBULATORY_CARE_PROVIDER_SITE_OTHER): Payer: Medicaid Other | Admitting: Family Medicine

## 2017-01-28 DIAGNOSIS — Z308 Encounter for other contraceptive management: Secondary | ICD-10-CM

## 2017-01-28 DIAGNOSIS — Z30430 Encounter for insertion of intrauterine contraceptive device: Secondary | ICD-10-CM

## 2017-01-28 LAB — POCT PREGNANCY, URINE: Preg Test, Ur: NEGATIVE

## 2017-01-28 MED ORDER — LEVONORGESTREL 18.6 MCG/DAY IU IUD
INTRAUTERINE_SYSTEM | Freq: Once | INTRAUTERINE | Status: AC
  Administered 2017-01-28: 16:00:00 via INTRAUTERINE

## 2017-01-28 NOTE — Patient Instructions (Signed)

## 2017-01-28 NOTE — Progress Notes (Signed)
   Subjective:    Patient ID: Kaitlyn Frank is a 21 y.o. female presenting with No chief complaint on file.  on 01/28/2017  HPI: Here for IUD insertion. Kaitlyn Frank is 9 wks pp. No intercourse.  Review of Systems  Constitutional: Negative for chills and fever.  Respiratory: Negative for shortness of breath.   Cardiovascular: Negative for chest pain.  Gastrointestinal: Negative for abdominal pain, nausea and vomiting.  Genitourinary: Negative for dysuria.  Skin: Negative for rash.      Objective:    Physical Exam  Constitutional: Kaitlyn Frank is oriented to person, place, and time. Kaitlyn Frank appears well-developed and well-nourished. No distress.  HENT:  Head: Normocephalic and atraumatic.  Eyes: No scleral icterus.  Neck: Neck supple.  Cardiovascular: Normal rate.   Pulmonary/Chest: Effort normal.  Abdominal: Soft.  Neurological: Kaitlyn Frank is alert and oriented to person, place, and time.  Skin: Skin is warm and dry.  Psychiatric: Kaitlyn Frank has a normal mood and affect.   Procedure: Patient identified, informed consent performed, signed copy in chart, time out was performed.  Urine pregnancy test negative.  Speculum placed in the vagina.  Cervix visualized.  Cleaned with Betadine x 2.  Grasped anteriourly with a single tooth tenaculum.  Uterus sounded to 9 cm.  Liletta IUD placed per manufacturer's recommendations.  Strings trimmed to 3 cm.   Patient given post procedure instructions and Liletta care card with expiration date.  Patient is asked to check IUD strings periodically. [place]aitlyn Frank [place] leave the country [place] weeks.      Assessment & Plan:  Encounter for IUD insertion - Liletta placed  Return if symptoms worsen or fail to improve.   Reva Boresanya S Shalamar Crays 01/28/2017 4:47 PM

## 2017-02-01 ENCOUNTER — Ambulatory Visit (INDEPENDENT_AMBULATORY_CARE_PROVIDER_SITE_OTHER): Payer: Medicaid Other | Admitting: Obstetrics & Gynecology

## 2017-02-01 ENCOUNTER — Encounter: Payer: Self-pay | Admitting: Obstetrics & Gynecology

## 2017-02-01 DIAGNOSIS — T839XXA Unspecified complication of genitourinary prosthetic device, implant and graft, initial encounter: Secondary | ICD-10-CM | POA: Insufficient documentation

## 2017-02-01 DIAGNOSIS — Z308 Encounter for other contraceptive management: Secondary | ICD-10-CM

## 2017-02-01 DIAGNOSIS — T8339XA Other mechanical complication of intrauterine contraceptive device, initial encounter: Secondary | ICD-10-CM

## 2017-02-01 NOTE — Progress Notes (Signed)
Subjective:     Patient ID: Kaitlyn Frank, female   DOB: 08/27/1995, 21 y.o.   MRN: [id number] Can feel her IUD string, placed 4 days ago HPI  G1P1001 No LMP recorded. She can feel the IUD string and asked to be checked. No pain   Review of Systems  Genitourinary: Negative for pelvic pain, vaginal bleeding, vaginal discharge and vaginal pain.       Objective:   Physical Exam  Constitutional: She appears well-developed. No distress.  Genitourinary: Vagina normal. No vaginal discharge found.  Genitourinary Comments: String at the os 4 cm trimmed to 2.5 cm  Vitals reviewed.      Assessment:     S/P IUD insertion string trimmed for comfort    Plan:     Report further complication and f/[u as scheduled  Adam PhenixArnold, James G, MD 02/01/2017

## 2017-02-01 NOTE — Patient Instructions (Signed)

## 2019-06-30 NOTE — L&D Delivery Note (Signed)
Delivery Note AROM w/clear fluid at 0015, cx 8/100/-1  Shortly after, pt was C/C/+2.  After a 3 ctx 2nd stage, at 12:53 AM a viable female was delivered via Vaginal, Spontaneous (Presentation: Left Occiput Anterior). loose nuchal X1, reduced.  APGAR: 9, 9; weight pending.  After 1 minute, the cord was clamped and cut. 40 units of pitocin diluted in 1000cc LR was infused rapidly IV.  The placenta separated spontaneously and delivered via CCT and maternal pushing effort.  It was inspected and appears to be intact with a 3 VC.    Anesthesia: Epidural Episiotomy: None Lacerations: None Suture Repair:  Est. Blood Loss (mL): 187  Mom to postpartum.  Baby to Couplet care / Skin to Skin.  Kaitlyn Frank 02/23/2020, 1:13 AM

## 2020-01-29 ENCOUNTER — Other Ambulatory Visit: Payer: Medicaid Other

## 2020-01-29 ENCOUNTER — Other Ambulatory Visit: Payer: Self-pay

## 2020-01-29 DIAGNOSIS — O0990 Supervision of high risk pregnancy, unspecified, unspecified trimester: Secondary | ICD-10-CM

## 2020-01-30 LAB — CBC/D/PLT+RPR+RH+ABO+RUB AB...
Antibody Screen: NEGATIVE
Basophils Absolute: 0 10*3/uL (ref 0.0–0.2)
Basos: 0 %
EOS (ABSOLUTE): 0.1 10*3/uL (ref 0.0–0.4)
Eos: 1 %
HCV Ab: <0.1 s/co ratio (ref 0.0–0.9)
HIV Screen 4th Generation wRfx: NONREACTIVE
Hematocrit: 30.3 % — ABNORMAL LOW (ref 34.0–46.6)
Hemoglobin: 10.4 g/dL — ABNORMAL LOW (ref 11.1–15.9)
Hepatitis B Surface Ag: NEGATIVE
Immature Grans (Abs): 0 10*3/uL (ref 0.0–0.1)
Immature Granulocytes: 1 %
Lymphocytes Absolute: 2.4 10*3/uL (ref 0.7–3.1)
Lymphs: 33 %
MCH: 31 pg (ref 26.6–33.0)
MCHC: 34.3 g/dL (ref 31.5–35.7)
MCV: 90 fL (ref 79–97)
Monocytes Absolute: 0.4 10*3/uL (ref 0.1–0.9)
Monocytes: 6 %
Neutrophils Absolute: 4.3 10*3/uL (ref 1.4–7.0)
Neutrophils: 59 %
Platelets: 231 10*3/uL (ref 150–450)
RBC: 3.36 x10E6/uL — ABNORMAL LOW (ref 3.77–5.28)
RDW: 12.7 % (ref 11.7–15.4)
RPR Ser Ql: NONREACTIVE
Rh Factor: POSITIVE
Rubella Antibodies, IGG: 19.7 index (ref >0.99)
WBC: 7.3 10*3/uL (ref 3.4–10.8)

## 2020-01-30 LAB — HEMOGLOBIN A1C
Est. average glucose Bld gHb Est-mCnc: 108 mg/dL
Hgb A1c MFr Bld: 5.4 % (ref 4.8–5.6)

## 2020-01-30 LAB — HCV INTERPRETATION

## 2020-02-07 ENCOUNTER — Ambulatory Visit (INDEPENDENT_AMBULATORY_CARE_PROVIDER_SITE_OTHER): Payer: Medicaid Other | Admitting: Obstetrics & Gynecology

## 2020-02-07 ENCOUNTER — Other Ambulatory Visit (HOSPITAL_COMMUNITY)
Admission: RE | Admit: 2020-02-07 | Discharge: 2020-02-07 | Disposition: A | Discharge status: Home / Self Care | Payer: Medicaid Other | Source: Ambulatory Visit | Attending: Obstetrics and Gynecology | Admitting: Obstetrics and Gynecology

## 2020-02-07 ENCOUNTER — Other Ambulatory Visit: Payer: Self-pay

## 2020-02-07 ENCOUNTER — Encounter: Payer: Self-pay | Admitting: Obstetrics & Gynecology

## 2020-02-07 ENCOUNTER — Encounter: Payer: Self-pay | Admitting: *Deleted

## 2020-02-07 VITALS — BP 102/71 | HR 97 | Wt 119.6 lb

## 2020-02-07 DIAGNOSIS — Z3490 Encounter for supervision of normal pregnancy, unspecified, unspecified trimester: Secondary | ICD-10-CM | POA: Diagnosis not present

## 2020-02-07 DIAGNOSIS — Z3A38 38 weeks gestation of pregnancy: Secondary | ICD-10-CM

## 2020-02-07 DIAGNOSIS — Z3483 Encounter for supervision of other normal pregnancy, third trimester: Secondary | ICD-10-CM

## 2020-02-07 NOTE — Patient Instructions (Signed)

## 2020-02-07 NOTE — Progress Notes (Signed)
Here for new OB, had some prenatal care in [place]. Moved to the [place] about a month ago.   Bryson Dames., RN

## 2020-02-07 NOTE — Addendum Note (Signed)
Addended by: Gerome Apley on: 02/07/2020 04:26 PM   Modules accepted: Orders

## 2020-02-07 NOTE — Addendum Note (Signed)
Addended by: Gerome Apley on: 02/07/2020 04:50 PM   Modules accepted: Orders

## 2020-02-07 NOTE — Progress Notes (Signed)
  Subjective:    Kaitlyn Frank is a G2P1001 [redacted]w[redacted]d being seen today for her first obstetrical visit.  Her obstetrical history is significant for late [facility]. Patient does intend to breast feed. Pregnancy history fully reviewed.  Patient reports no bleeding, no cramping, no leaking, occasional contractions and intermittent pelvic pressure.  Vitals:   02/07/20 1459  BP: 102/71  Pulse: 97  Weight: 119 lb 9.6 oz (54.3 kg)    HISTORY: OB History  Gravida Para Term Preterm AB Living  2 1 1  0 0 1  SAB TAB Ectopic Multiple Live Births  0 0 0 0 1    # Outcome Date GA Lbr Len/2nd Weight Sex Delivery Anes PTL Lv  2 Current           1 Term 11/20/16 [redacted]w[redacted]d 06:20 / 01:35 7 lb 4.2 oz (3.295 kg) F Vag-Spont EPI  LIV   Past Medical History:  Diagnosis Date  . Medical history non-contributory    Past Surgical History:  Procedure Laterality Date  . NO PAST SURGERIES     Family History  Problem Relation Age of Onset  . Diabetes Maternal Aunt      Exam    Pelvic Exam:    Perineum: No Hemorrhoids   Vulva: normal   Vagina:  normal mucosa, normal discharge   Cervix: no cervical motion tenderness and no lesions   Adnexa: normal adnexa and no mass, fullness, tenderness   Bony Pelvis: average   Skin: normal coloration and turgor, no rashes    Neurologic: oriented, normal   Extremities: normal strength, tone, and muscle mass   HEENT PERRLA   Mouth/Teeth mucous membranes moist, pharynx normal without lesions   Neck supple   Cardiovascular: regular rate and rhythm   Respiratory:  appears well, vitals normal, no respiratory distress, acyanotic, normal RR, ear and throat exam is normal, neck free of mass or lymphadenopathy, chest clear, no wheezing, crepitations, rhonchi, normal symmetric air entry   Abdomen: soft, non-tender; bowel sounds normal; no masses,  no organomegaly   Urinary: urethral meatus normal and bladder not palpable      Assessment:    Pregnancy:  G2P1001 Patient Active Problem List   Diagnosis Date Noted  . Supervision of low-risk pregnancy 02/07/2020  . IUD complication (HCC) 02/01/2017        Plan:     Initial labs drawn. Prenatal vitamins. Follow up dating U/S scheduled for tomorrow to confirm dating.  Problem list reviewed and updated.  Ultrasound discussed; fetal survey: requested.  Follow up in 1 week. 85% of 20 min visit spent on counseling and coordination of care.  Precautions given.    04/03/2017 02/07/2020

## 2020-02-08 ENCOUNTER — Ambulatory Visit: Payer: Medicaid Other

## 2020-02-09 ENCOUNTER — Ambulatory Visit: Payer: Medicaid Other | Attending: Obstetrics and Gynecology

## 2020-02-09 ENCOUNTER — Other Ambulatory Visit: Payer: Self-pay

## 2020-02-09 ENCOUNTER — Other Ambulatory Visit: Payer: Self-pay | Admitting: *Deleted

## 2020-02-09 DIAGNOSIS — Z3490 Encounter for supervision of normal pregnancy, unspecified, unspecified trimester: Secondary | ICD-10-CM | POA: Insufficient documentation

## 2020-02-09 DIAGNOSIS — Z3687 Encounter for antenatal screening for uncertain dates: Secondary | ICD-10-CM

## 2020-02-09 DIAGNOSIS — Z363 Encounter for antenatal screening for malformations: Secondary | ICD-10-CM | POA: Diagnosis not present

## 2020-02-09 DIAGNOSIS — Z3A35 35 weeks gestation of pregnancy: Secondary | ICD-10-CM | POA: Diagnosis not present

## 2020-02-09 DIAGNOSIS — O4100X0 Oligohydramnios, unspecified trimester, not applicable or unspecified: Secondary | ICD-10-CM

## 2020-02-09 LAB — GC/CHLAMYDIA PROBE AMP (~~LOC~~) NOT AT ARMC
Chlamydia: NEGATIVE
Comment: NEGATIVE
Comment: NORMAL
Neisseria Gonorrhea: NEGATIVE

## 2020-02-09 LAB — URINE CULTURE, OB REFLEX

## 2020-02-09 LAB — CULTURE, OB URINE

## 2020-02-11 LAB — CULTURE, BETA STREP (GROUP B ONLY): Strep Gp B Culture: NEGATIVE

## 2020-02-13 ENCOUNTER — Telehealth: Payer: Self-pay | Admitting: Lactation Services

## 2020-02-13 ENCOUNTER — Other Ambulatory Visit (HOSPITAL_COMMUNITY): Payer: Self-pay | Admitting: Obstetrics & Gynecology

## 2020-02-13 MED ORDER — NITROFURANTOIN MONOHYD MACRO 100 MG PO CAPS
100.0000 mg | ORAL_CAPSULE | Freq: Two times a day (BID) | ORAL | 0 refills | Status: AC
Start: 2020-02-13 — End: 2020-02-20

## 2020-02-13 NOTE — Telephone Encounter (Signed)
-----   Message from Malachy Chamber, MD sent at 02/13/2020 12:13 AM EDT ----- Macrobid sent to pharmacy, will need repeat urine Cx in 3 -4 weeks

## 2020-02-13 NOTE — Telephone Encounter (Signed)
Called patient on [phone] and received message that phone is not in service.   Called alternate number and female voicemail received. Left message to have patient call the office for results.   Will send My Chart message.

## 2020-02-15 ENCOUNTER — Encounter: Payer: Medicaid Other | Admitting: Obstetrics & Gynecology

## 2020-02-16 ENCOUNTER — Ambulatory Visit: Payer: Medicaid Other | Attending: Obstetrics and Gynecology

## 2020-02-16 ENCOUNTER — Ambulatory Visit: Payer: Medicaid Other | Admitting: *Deleted

## 2020-02-16 ENCOUNTER — Encounter: Payer: Self-pay | Admitting: *Deleted

## 2020-02-16 ENCOUNTER — Other Ambulatory Visit: Payer: Self-pay

## 2020-02-16 DIAGNOSIS — O4103X0 Oligohydramnios, third trimester, not applicable or unspecified: Secondary | ICD-10-CM

## 2020-02-16 DIAGNOSIS — Z3A36 36 weeks gestation of pregnancy: Secondary | ICD-10-CM | POA: Diagnosis not present

## 2020-02-16 DIAGNOSIS — O0933 Supervision of pregnancy with insufficient antenatal care, third trimester: Secondary | ICD-10-CM

## 2020-02-16 DIAGNOSIS — Z3490 Encounter for supervision of normal pregnancy, unspecified, unspecified trimester: Secondary | ICD-10-CM | POA: Insufficient documentation

## 2020-02-16 DIAGNOSIS — O4100X0 Oligohydramnios, unspecified trimester, not applicable or unspecified: Secondary | ICD-10-CM | POA: Insufficient documentation

## 2020-02-16 DIAGNOSIS — Z362 Encounter for other antenatal screening follow-up: Secondary | ICD-10-CM

## 2020-02-19 ENCOUNTER — Other Ambulatory Visit: Payer: Self-pay | Admitting: *Deleted

## 2020-02-19 DIAGNOSIS — O4100X0 Oligohydramnios, unspecified trimester, not applicable or unspecified: Secondary | ICD-10-CM

## 2020-02-22 ENCOUNTER — Encounter: Payer: Self-pay | Admitting: *Deleted

## 2020-02-22 ENCOUNTER — Encounter (HOSPITAL_COMMUNITY): Payer: Self-pay | Admitting: Obstetrics & Gynecology

## 2020-02-22 ENCOUNTER — Inpatient Hospital Stay (HOSPITAL_COMMUNITY): Payer: Medicaid Other | Admitting: Anesthesiology

## 2020-02-22 ENCOUNTER — Other Ambulatory Visit: Payer: Self-pay

## 2020-02-22 ENCOUNTER — Ambulatory Visit (HOSPITAL_BASED_OUTPATIENT_CLINIC_OR_DEPARTMENT_OTHER): Payer: Medicaid Other

## 2020-02-22 ENCOUNTER — Ambulatory Visit: Payer: Medicaid Other | Admitting: *Deleted

## 2020-02-22 ENCOUNTER — Inpatient Hospital Stay (HOSPITAL_COMMUNITY)
Admission: AD | Admit: 2020-02-22 | Discharge: 2020-02-24 | DRG: 807 | Disposition: A | Discharge status: Home / Self Care | Payer: Medicaid Other | Attending: Obstetrics & Gynecology | Admitting: Obstetrics & Gynecology

## 2020-02-22 VITALS — BP 97/62 | HR 89

## 2020-02-22 DIAGNOSIS — Z20822 Contact with and (suspected) exposure to covid-19: Secondary | ICD-10-CM | POA: Diagnosis present

## 2020-02-22 DIAGNOSIS — Z3490 Encounter for supervision of normal pregnancy, unspecified, unspecified trimester: Secondary | ICD-10-CM

## 2020-02-22 DIAGNOSIS — O4100X0 Oligohydramnios, unspecified trimester, not applicable or unspecified: Secondary | ICD-10-CM

## 2020-02-22 DIAGNOSIS — Z3A37 37 weeks gestation of pregnancy: Secondary | ICD-10-CM

## 2020-02-22 DIAGNOSIS — O0933 Supervision of pregnancy with insufficient antenatal care, third trimester: Secondary | ICD-10-CM

## 2020-02-22 DIAGNOSIS — O26893 Other specified pregnancy related conditions, third trimester: Secondary | ICD-10-CM | POA: Diagnosis present

## 2020-02-22 DIAGNOSIS — O4103X0 Oligohydramnios, third trimester, not applicable or unspecified: Secondary | ICD-10-CM | POA: Insufficient documentation

## 2020-02-22 DIAGNOSIS — D509 Iron deficiency anemia, unspecified: Secondary | ICD-10-CM | POA: Diagnosis present

## 2020-02-22 DIAGNOSIS — O9081 Anemia of the puerperium: Secondary | ICD-10-CM | POA: Diagnosis present

## 2020-02-22 LAB — CBC
HCT: 33 % — ABNORMAL LOW (ref 36.0–46.0)
Hemoglobin: 10.7 g/dL — ABNORMAL LOW (ref 12.0–15.0)
MCH: 29.7 pg (ref 26.0–34.0)
MCHC: 32.4 g/dL (ref 30.0–36.0)
MCV: 91.7 fL (ref 80.0–100.0)
Platelets: 227 10*3/uL (ref 150–400)
RBC: 3.6 MIL/uL — ABNORMAL LOW (ref 3.87–5.11)
RDW: 13.2 % (ref 11.5–15.5)
WBC: 9.1 10*3/uL (ref 4.0–10.5)
nRBC: 0 % (ref 0.0–0.2)

## 2020-02-22 LAB — TYPE AND SCREEN
ABO/RH(D): O POS
Antibody Screen: NEGATIVE

## 2020-02-22 MED ORDER — HYDROXYZINE HCL 50 MG PO TABS: 50.0000 mg | ORAL_TABLET | Freq: Four times a day (QID) | ORAL | Status: DC | PRN

## 2020-02-22 MED ORDER — LACTATED RINGERS IV SOLN: 500.0000 mL | Freq: Once | INTRAVENOUS | Status: DC

## 2020-02-22 MED ORDER — OXYCODONE-ACETAMINOPHEN 5-325 MG PO TABS: 1.0000 | ORAL_TABLET | ORAL | Status: DC | PRN

## 2020-02-22 MED ORDER — EPHEDRINE 5 MG/ML INJ: 10.0000 mg | INTRAVENOUS | Status: DC | PRN

## 2020-02-22 MED ORDER — LACTATED RINGERS IV SOLN: INTRAVENOUS | Status: DC

## 2020-02-22 MED ORDER — FENTANYL CITRATE (PF) 100 MCG/2ML IJ SOLN
50.0000 ug | INTRAMUSCULAR | Status: DC | PRN
  Administered 2020-02-22: 100 ug via INTRAVENOUS
  Filled 2020-02-22: qty 2

## 2020-02-22 MED ORDER — SOD CITRATE-CITRIC ACID 500-334 MG/5ML PO SOLN: 30.0000 mL | ORAL | Status: DC | PRN

## 2020-02-22 MED ORDER — PHENYLEPHRINE 40 MCG/ML (10ML) SYRINGE FOR IV PUSH (FOR BLOOD PRESSURE SUPPORT)
80.0000 ug | PREFILLED_SYRINGE | INTRAVENOUS | Status: DC | PRN
  Filled 2020-02-22: qty 10

## 2020-02-22 MED ORDER — OXYCODONE-ACETAMINOPHEN 5-325 MG PO TABS: 2.0000 | ORAL_TABLET | ORAL | Status: DC | PRN

## 2020-02-22 MED ORDER — LACTATED RINGERS IV SOLN: 500.0000 mL | INTRAVENOUS | Status: DC | PRN

## 2020-02-22 MED ORDER — LIDOCAINE HCL (PF) 1 % IJ SOLN: 30.0000 mL | INTRAMUSCULAR | Status: DC | PRN

## 2020-02-22 MED ORDER — ONDANSETRON HCL 4 MG/2ML IJ SOLN: 4.0000 mg | Freq: Four times a day (QID) | INTRAMUSCULAR | Status: DC | PRN

## 2020-02-22 MED ORDER — OXYTOCIN BOLUS FROM INFUSION
333.0000 mL | Freq: Once | INTRAVENOUS | Status: AC
  Administered 2020-02-23: 333 mL via INTRAVENOUS

## 2020-02-22 MED ORDER — ACETAMINOPHEN 325 MG PO TABS: 650.0000 mg | ORAL_TABLET | ORAL | Status: DC | PRN

## 2020-02-22 MED ORDER — DIPHENHYDRAMINE HCL 50 MG/ML IJ SOLN: 12.5000 mg | INTRAMUSCULAR | Status: DC | PRN

## 2020-02-22 MED ORDER — FENTANYL-BUPIVACAINE-NACL 0.5-0.125-0.9 MG/250ML-% EP SOLN: 12.0000 mL/h | EPIDURAL | Status: DC | PRN

## 2020-02-22 MED ORDER — FENTANYL-BUPIVACAINE-NACL 0.5-0.125-0.9 MG/250ML-% EP SOLN
EPIDURAL | Status: AC
  Filled 2020-02-22: qty 250

## 2020-02-22 MED ORDER — OXYTOCIN-SODIUM CHLORIDE 30-0.9 UT/500ML-% IV SOLN
2.5000 [IU]/h | INTRAVENOUS | Status: DC
  Administered 2020-02-23: 2.5 [IU]/h via INTRAVENOUS
  Filled 2020-02-22: qty 500

## 2020-02-22 MED ORDER — FLEET ENEMA 7-19 GM/118ML RE ENEM: 1.0000 | ENEMA | RECTAL | Status: DC | PRN

## 2020-02-22 MED ORDER — PHENYLEPHRINE 40 MCG/ML (10ML) SYRINGE FOR IV PUSH (FOR BLOOD PRESSURE SUPPORT): 80.0000 ug | PREFILLED_SYRINGE | INTRAVENOUS | Status: DC | PRN

## 2020-02-22 NOTE — MAU Note (Signed)
..  Kaitlyn Frank [name] is a 24 y.o. at [redacted]w[redacted]d here in MAU reporting: CTX every 10 minutes for the past "2-3" hours. Denies vaginal bleeding or LOF. +FM  Pain score: 6/10 There were no vitals filed for this visit.   FHT: monitors applied. 145 FHR. Assessing.  Lab orders placed from triage: MAU labor eval standing orders

## 2020-02-22 NOTE — Anesthesia Preprocedure Evaluation (Signed)
Anesthesia Evaluation  Patient identified by MRN, date of birth, ID band Patient awake    Reviewed: Allergy & Precautions, Patient's Chart, lab work & pertinent test results  Airway Mallampati: II  TM Distance: >3 FB Neck ROM: Full    Dental no notable dental hx. (+) Teeth Intact   Pulmonary neg pulmonary ROS,    Pulmonary exam normal breath sounds clear to auscultation       Cardiovascular negative cardio ROS Normal cardiovascular exam Rhythm:Regular Rate:Normal     Neuro/Psych negative neurological ROS  negative psych ROS   GI/Hepatic Neg liver ROS, GERD  ,  Endo/Other  negative endocrine ROS  Renal/GU negative Renal ROS  negative genitourinary   Musculoskeletal negative musculoskeletal ROS (+)   Abdominal   Peds  Hematology  (+) anemia ,   Anesthesia Other Findings   Reproductive/Obstetrics (+) Pregnancy                             Anesthesia Physical Anesthesia Plan  ASA: II  Anesthesia Plan: Epidural   Post-op Pain Management:    Induction:   PONV Risk Score and Plan:   Airway Management Planned: Natural Airway  Additional Equipment:   Intra-op Plan:   Post-operative Plan:   Informed Consent: I have reviewed the patients History and Physical, chart, labs and discussed the procedure including the risks, benefits and alternatives for the proposed anesthesia with the patient or authorized representative who has indicated his/her understanding and acceptance.       Plan Discussed with: Anesthesiologist  Anesthesia Plan Comments:         Anesthesia Quick Evaluation  

## 2020-02-22 NOTE — MAU Note (Signed)
Covid swab obtained without difficulty and pt tol well. No symptoms 

## 2020-02-22 NOTE — H&P (Signed)
Erabella Musa Dariann Huckaba is a 24 y.o. female G2P1001 with IUP at [redacted]w[redacted]d by [place] at 35 weeks  presenting for contractions.  She reports positive fetal movement. She denies leakage of fluid or vaginal bleeding.  Prenatal History/Complications: PNC at [facility] X 1 visit 2 weeks ago Term SVD 2018 w/o complications  Pregnancy complications:  Low-normal AFI  - Past Medical History: Past Medical History:  Diagnosis Date   Medical history non-contributory     Past Surgical History: Past Surgical History:  Procedure Laterality Date   NO PAST SURGERIES      Obstetrical History: OB History     Gravida  2   Para  1   Term  1   Preterm  0   AB  0   Living  1      SAB  0   TAB  0   Ectopic  0   Multiple  0   Live Births  1            Social History: Social History   Socioeconomic History   Marital status: Married    Spouse name: Not on file   Number of children: Not on file   Years of education: Not on file   Highest education level: Not on file  Occupational History   Not on file  Tobacco Use   Smoking status: Never Smoker   Smokeless tobacco: Never Used  Vaping Use   Vaping Use: Never used  Substance and Sexual Activity   Alcohol use: No   Drug use: No   Sexual activity: Not Currently    Birth control/protection: None    Comment: Patient wants IUD  Other Topics Concern   Not on file  Social History Narrative   Not on file   Social Determinants of Health   Financial Resource Strain:    Difficulty of Paying Living Expenses: Not on file  Food Insecurity: No Food Insecurity   Worried About Running Out of Food in the Last Year: Never true   Ran Out of Food in the Last Year: Never true  Transportation Needs: No Transportation Needs   Lack of Transportation (Medical): No   Lack of Transportation (Non-Medical): No  Physical Activity:    Days of Exercise per Week: Not on file   Minutes of Exercise per Session: Not on file  Stress:    Feeling of  Stress : Not on file  Social Connections:    Frequency of Communication with Friends and Family: Not on file   Frequency of Social Gatherings with Friends and Family: Not on file   Attends Religious Services: Not on file   Active Member of Clubs or Organizations: Not on file   Attends Banker Meetings: Not on file   Marital Status: Not on file    Family History: Family History  Problem Relation Age of Onset   Diabetes Maternal Aunt     Allergies: No Known Allergies  Medications Prior to Admission  Medication Sig Dispense Refill Last Dose   nitrofurantoin, macrocrystal-monohydrate, (MACROBID) 100 MG capsule Take 100 mg by mouth 2 (two) times daily.   02/22/2020 at Unknown time   Prenatal Vit-Fe Fumarate-FA (MULTIVITAMIN-PRENATAL) 27-0.8 MG TABS tablet Take 1 tablet by mouth daily at 12 noon. 30 each 12 Past Week at Unknown time    Review of Systems   Constitutional: Negative for fever and chills Eyes: Negative for visual disturbances Respiratory: Negative for shortness of breath, dyspnea Cardiovascular: Negative for chest pain  or palpitations  Gastrointestinal: Negative for vomiting, diarrhea and constipation.  POSITIVE for abdominal pain (contractions) Genitourinary: Negative for dysuria and urgency Musculoskeletal: Negative for back pain, joint pain, myalgias  Neurological: Negative for dizziness and headaches  Blood pressure 103/65, pulse (!) 103, temperature 98.6 F (37 C), temperature source Oral, resp. rate 17, SpO2 99 %, currently breastfeeding. General appearance: alert, cooperative and no distress Lungs: normal respiratory effort Heart: regular rate and rhythm Abdomen: soft, non-tender; bowel sounds normal Extremities: Homans sign is negative, no sign of DVT DTR's 2+ Presentation: cephalic Fetal monitoring  Baseline: 145 bpm, Variability: Good {> 6 bpm), Accelerations: Reactive and Decelerations: Absent Uterine activity  2-4 minutes Dilation:  4 Effacement (%): 60, 70 Station: -1 Exam by:: Erle Crocker, RN>  Now 5.5 cms per RN   Prenatal labs: ABO, Rh: O/Positive/-- (08/02 1451) Antibody: Negative (08/02 1451) Rubella: 19.70 (08/02 1451) RPR: Non Reactive (08/02 1451)  HBsAg: Negative (08/02 1451)  HIV: Non Reactive (08/02 1451)  GBS: Negative/-- (08/11 1642)  Hgb A1C:  5.4 on 01/29/20 Genetic screening  n/a Anatomy US normal   Prenatal Transfer Tool  Maternal Diabetes: No Genetic Screening: Declined Maternal Ultrasounds/Referrals: Normal Fetal Ultrasounds or other Referrals:  None Maternal Substance Abuse:  No Significant Maternal Medications:  None Significant Maternal Lab Results: Group B Strep negative  No results found for this or any previous visit (from the past 24 hour(s)).  Assessment: Cailee Musa Theodore Rahrig is a 24 y.o. G2P1001 with an IUP at [redacted]w[redacted]d presenting for labor  Plan: #Labor: expectant management #Pain:  Per request #FWB Cat 1 #ID: GBS: neg  #MOF:  breast #MOC: outpt Vevelyn Pat 02/22/2020, 10:38 PM

## 2020-02-23 ENCOUNTER — Encounter (HOSPITAL_COMMUNITY): Payer: Self-pay | Admitting: Obstetrics & Gynecology

## 2020-02-23 ENCOUNTER — Other Ambulatory Visit: Payer: Self-pay | Admitting: *Deleted

## 2020-02-23 DIAGNOSIS — O0930 Supervision of pregnancy with insufficient antenatal care, unspecified trimester: Secondary | ICD-10-CM

## 2020-02-23 DIAGNOSIS — Z3A37 37 weeks gestation of pregnancy: Secondary | ICD-10-CM

## 2020-02-23 LAB — SARS CORONAVIRUS 2 BY RT PCR (HOSPITAL ORDER, PERFORMED IN ~~LOC~~ HOSPITAL LAB): SARS Coronavirus 2: NEGATIVE

## 2020-02-23 LAB — RPR: RPR Ser Ql: NONREACTIVE

## 2020-02-23 MED ORDER — BISACODYL 10 MG RE SUPP: 10.0000 mg | Freq: Every day | RECTAL | Status: DC | PRN

## 2020-02-23 MED ORDER — BENZOCAINE-MENTHOL 20-0.5 % EX AERO
1.0000 "application " | INHALATION_SPRAY | CUTANEOUS | Status: DC | PRN
  Administered 2020-02-23: 1 via TOPICAL
  Filled 2020-02-23: qty 56

## 2020-02-23 MED ORDER — PRENATAL MULTIVITAMIN CH
1.0000 | ORAL_TABLET | Freq: Every day | ORAL | Status: DC
  Administered 2020-02-23 – 2020-02-24 (×2): 1 via ORAL
  Filled 2020-02-23 (×2): qty 1

## 2020-02-23 MED ORDER — DOCUSATE SODIUM 100 MG PO CAPS
100.0000 mg | ORAL_CAPSULE | Freq: Two times a day (BID) | ORAL | Status: DC
  Administered 2020-02-23 – 2020-02-24 (×2): 100 mg via ORAL
  Filled 2020-02-23 (×2): qty 1

## 2020-02-23 MED ORDER — DIBUCAINE (PERIANAL) 1 % EX OINT: 1.0000 "application " | TOPICAL_OINTMENT | CUTANEOUS | Status: DC | PRN

## 2020-02-23 MED ORDER — METHYLERGONOVINE MALEATE 0.2 MG PO TABS: 0.2000 mg | ORAL_TABLET | ORAL | Status: DC | PRN

## 2020-02-23 MED ORDER — MEASLES, MUMPS & RUBELLA VAC IJ SOLR: 0.5000 mL | Freq: Once | INTRAMUSCULAR | Status: DC

## 2020-02-23 MED ORDER — TETANUS-DIPHTH-ACELL PERTUSSIS 5-2.5-18.5 LF-MCG/0.5 IM SUSP: 0.5000 mL | Freq: Once | INTRAMUSCULAR | Status: DC

## 2020-02-23 MED ORDER — ACETAMINOPHEN 325 MG PO TABS: 650.0000 mg | ORAL_TABLET | ORAL | Status: DC | PRN

## 2020-02-23 MED ORDER — LIDOCAINE HCL (PF) 1 % IJ SOLN
INTRAMUSCULAR | Status: DC | PRN
  Administered 2020-02-22 (×2): 4 mL via EPIDURAL

## 2020-02-23 MED ORDER — IBUPROFEN 600 MG PO TABS
600.0000 mg | ORAL_TABLET | Freq: Four times a day (QID) | ORAL | Status: DC
  Administered 2020-02-23 – 2020-02-24 (×6): 600 mg via ORAL
  Filled 2020-02-23 (×6): qty 1

## 2020-02-23 MED ORDER — BUPIVACAINE HCL (PF) 0.75 % IJ SOLN
INTRAMUSCULAR | Status: DC | PRN
Start: 2020-02-23 — End: 2020-02-23
  Administered 2020-02-23: 12 mL/h via EPIDURAL

## 2020-02-23 MED ORDER — WITCH HAZEL-GLYCERIN EX PADS: 1.0000 "application " | MEDICATED_PAD | CUTANEOUS | Status: DC | PRN

## 2020-02-23 MED ORDER — METHYLERGONOVINE MALEATE 0.2 MG/ML IJ SOLN: 0.2000 mg | INTRAMUSCULAR | Status: DC | PRN

## 2020-02-23 MED ORDER — FLEET ENEMA 7-19 GM/118ML RE ENEM: 1.0000 | ENEMA | Freq: Every day | RECTAL | Status: DC | PRN

## 2020-02-23 MED ORDER — COCONUT OIL OIL
1.0000 "application " | TOPICAL_OIL | Status: DC | PRN
  Administered 2020-02-23: 1 via TOPICAL

## 2020-02-23 MED ORDER — SIMETHICONE 80 MG PO CHEW: 80.0000 mg | CHEWABLE_TABLET | ORAL | Status: DC | PRN

## 2020-02-23 MED ORDER — ZOLPIDEM TARTRATE 5 MG PO TABS: 5.0000 mg | ORAL_TABLET | Freq: Every evening | ORAL | Status: DC | PRN

## 2020-02-23 MED ORDER — FERROUS SULFATE 325 (65 FE) MG PO TABS
325.0000 mg | ORAL_TABLET | Freq: Two times a day (BID) | ORAL | Status: DC
  Administered 2020-02-23 – 2020-02-24 (×3): 325 mg via ORAL
  Filled 2020-02-23 (×3): qty 1

## 2020-02-23 MED ORDER — DIPHENHYDRAMINE HCL 25 MG PO CAPS: 25.0000 mg | ORAL_CAPSULE | Freq: Four times a day (QID) | ORAL | Status: DC | PRN

## 2020-02-23 MED ORDER — ONDANSETRON HCL 4 MG PO TABS: 4.0000 mg | ORAL_TABLET | ORAL | Status: DC | PRN

## 2020-02-23 MED ORDER — ONDANSETRON HCL 4 MG/2ML IJ SOLN: 4.0000 mg | INTRAMUSCULAR | Status: DC | PRN

## 2020-02-23 NOTE — Anesthesia Procedure Notes (Signed)
Epidural Patient location during procedure: OB Start time: 02/22/2020 11:55 PM End time: 02/23/2020 12:03 AM  Staffing Anesthesiologist: Mal Amabile, MD Performed: anesthesiologist   Preanesthetic Checklist Completed: patient identified, IV checked, site marked, risks and benefits discussed, surgical consent, monitors and equipment checked, pre-op evaluation and timeout performed  Epidural Patient position: sitting Prep: DuraPrep and site prepped and draped Patient monitoring: continuous pulse ox and blood pressure Approach: midline Location: L3-L4 Injection technique: LOR air  Needle:  Needle type: Tuohy  Needle gauge: 17 G Needle length: 9 cm and 9 Needle insertion depth: 4 cm Catheter type: closed end flexible Catheter size: 19 Gauge Catheter at skin depth: 9 cm Test dose: negative and Other  Assessment Events: blood not aspirated, injection not painful, no injection resistance, no paresthesia and negative IV test  Additional Notes Patient identified. Risks and benefits discussed including failed block, incomplete  Pain control, post dural puncture headache, nerve damage, paralysis, blood pressure Changes, nausea, vomiting, reactions to medications-both toxic and allergic and post Partum back pain. All questions were answered. Patient expressed understanding and wished to proceed. Sterile technique was used throughout procedure. Epidural site was Dressed with sterile barrier dressing. No paresthesias, signs of intravascular injection Or signs of intrathecal spread were encountered.  Patient was more comfortable after the epidural was dosed. Please see RN's note for documentation of vital signs and FHR which are stable. Reason for block:procedure for pain

## 2020-02-23 NOTE — Discharge Summary (Signed)
Postpartum Discharge Summary      Patient Name: Kaitlyn Frank DOB: 1995-11-14 MRN: [id number]  Date of admission: 02/22/2020 Delivery date:02/23/2020  Delivering provider: Christin Fudge  Date of discharge: 02/24/2020  Admitting diagnosis: Indication for care in labor and delivery, antepartum [O75.9] Intrauterine pregnancy: [redacted]w[redacted]d    Secondary diagnosis:  Active Problems:   Late prenatal care in third trimester   Supervision of low-risk pregnancy   Indication for care in labor and delivery, antepartum   Iron deficiency anemia  Additional problems: none    Discharge diagnosis: Term Pregnancy Delivered                                              Post partum procedures:none Augmentation: AROM Complications: None  Hospital course: Onset of Labor With Vaginal Delivery      24y.o. yo G2P1001 at 338w3das admitted in Active Labor on 02/22/2020. Patient had an uncomplicated labor course as follows:  Membrane Rupture Time/Date: 12:20 AM ,02/23/2020   Delivery Method:Vaginal, Spontaneous  Episiotomy: None  Lacerations:  None  Patient had an uncomplicated postpartum course.  She is ambulating, tolerating a regular diet, passing flatus, and urinating well. Patient is discharged home in stable condition on 02/24/20.  Newborn Data: Birth date:02/23/2020  Birth time:12:53 AM  Gender:Female  Living status:Living  Apgars:9 ,9  Weight:2849 g   Magnesium Sulfate received: No BMZ received: No Rhophylac:N/A MMR:N/A T-DaP:Given postpartum Flu: N/A Transfusion:No  Physical exam  Vitals:   02/23/20 0726 02/23/20 1132 02/23/20 1735 02/23/20 2203  BP: (!) _0 92/61  Pulse: (!) 58 85 (!) 102 74  Resp: _1 Temp:  98.2 F (36.8 C) 98.1 F (36.7 C) 98.4 F (36.9 C)  TempSrc:  Oral Oral   SpO2: 100%      General: alert, cooperative and no distress Lochia: appropriate Uterine Fundus: firm Incision: N/A DVT Evaluation: No evidence of DVT  seen on physical exam. Labs: Lab Results  Component Value Date   WBC 9.1 02/22/2020   HGB 10.7 (L) 02/22/2020   HCT 33.0 (L) 02/22/2020   MCV 91.7 02/22/2020   PLT 227 02/22/2020   No flowsheet data found. Edinburgh Score: Edinburgh Postnatal Depression Scale Screening Tool 02/23/2020  I have been able to laugh and see the funny side of things. 0  I have looked forward with enjoyment to things. 0  I have blamed myself unnecessarily when things went wrong. 0  I have been anxious or worried for no good reason. 0  I have felt scared or panicky for no good reason. 0  Things have been getting on top of me. 0  I have been so unhappy that I have had difficulty sleeping. 0  I have felt sad or miserable. 0  I have been so unhappy that I have been crying. 0  The thought of harming myself has occurred to me. 0  Edinburgh Postnatal Depression Scale Total 0     After visit meds:  Allergies as of 02/24/2020   No Known Allergies     Medication List    STOP taking these medications   nitrofurantoin (macrocrystal-monohydrate) 100 MG capsule Commonly known as: MACROBID     TAKE these medications   acetaminophen 500 MG tablet Commonly known as: TYLENOL Take 2 tablets (1,000 mg total) by mouth every  6 (six) hours as needed for mild pain or moderate pain (for pain scale < 4).   ferrous sulfate 325 (65 FE) MG tablet Take 1 tablet (325 mg total) by mouth every other day. Take with vitamin C   ibuprofen 600 MG tablet Commonly known as: ADVIL Take 1 tablet (600 mg total) by mouth every 6 (six) hours.   multivitamin-prenatal 27-0.8 MG Tabs tablet Take 1 tablet by mouth daily at 12 noon.   vitamin C 250 MG tablet Commonly known as: ASCORBIC ACID Take 1 tablet (250 mg total) by mouth every other day. Take with iron.        Discharge home in stable condition Infant Feeding: Breast Infant Disposition:home with mother Discharge instruction: per After Visit Summary and Postpartum  booklet. Activity: Advance as tolerated. Pelvic rest for 6 weeks.  Diet: routine diet Future Appointments: Future Appointments  Date Time Provider Britt  02/26/2020  2:15 PM Truett Mainland, DO [facility]   Follow up Visit:   Please schedule this patient for a In person postpartum visit in 4 weeks with the following provider: Any provider. Additional Postpartum F/U:  Low risk pregnancy complicated by: limited [facility] Delivery mode:  Vaginal, Spontaneous  Anticipated Birth Control:  IUD at [facility] visit   2/00/3794 Arrie Senate, MD

## 2020-02-23 NOTE — Anesthesia Postprocedure Evaluation (Signed)
Anesthesia Post Note  Patient: Public relations account executive  Procedure(s) Performed: AN AD HOC LABOR EPIDURAL     Patient location during evaluation: Mother Baby Anesthesia Type: Epidural Level of consciousness: awake and alert and oriented Pain management: satisfactory to patient Vital Signs Assessment: post-procedure vital signs reviewed and stable Respiratory status: respiratory function stable Cardiovascular status: stable Postop Assessment: no headache, no backache, epidural receding, patient able to bend at knees, no signs of nausea or vomiting, adequate PO intake and able to ambulate Anesthetic complications: no   No complications documented.  Last Vitals:  Vitals:   02/23/20 0400 02/23/20 0726  BP: (!) 89/52 (!) 87/60  Pulse: 69 (!) 58  Resp: 16 17  Temp: 36.7 C   SpO2:  100%    Last Pain:  Vitals:   02/23/20 0400  TempSrc: Oral  PainSc: 0-No pain   Pain Goal: Patients Stated Pain Goal: 0 (02/22/20 2114)                 Karleen Dolphin

## 2020-02-23 NOTE — Lactation Note (Signed)
This note was copied from a baby's chart. Lactation Consultation Note  Patient Name: Kaitlyn Frank [id number] Date: 02/23/2020   Infant is 18 hours old, 37 weeks. LC went to see Mom. She states breastfeeding is going well and declined LC service at this time.

## 2020-02-24 DIAGNOSIS — D509 Iron deficiency anemia, unspecified: Secondary | ICD-10-CM

## 2020-02-24 MED ORDER — ACETAMINOPHEN 500 MG PO TABS: 1000.0000 mg | ORAL_TABLET | Freq: Four times a day (QID) | ORAL | 0 refills | Status: DC | PRN

## 2020-02-24 MED ORDER — IBUPROFEN 600 MG PO TABS
600.0000 mg | ORAL_TABLET | Freq: Four times a day (QID) | ORAL | 0 refills | Status: DC
Start: 2020-02-24 — End: 2022-05-12

## 2020-02-24 MED ORDER — FERROUS SULFATE 325 (65 FE) MG PO TABS
325.0000 mg | ORAL_TABLET | ORAL | 0 refills | Status: DC
Start: 2020-02-24 — End: 2022-05-27

## 2020-02-24 MED ORDER — VITAMIN C 250 MG PO TABS: 250.0000 mg | ORAL_TABLET | ORAL | 0 refills | Status: DC

## 2020-02-24 NOTE — Clinical Social Work Maternal (Signed)
CLINICAL SOCIAL WORK MATERNAL/CHILD NOTE  Patient Details  Name: Kaitlyn Frank MRN: [id number] Date of Birth: 12/03/1995  Date:  02/24/2020  Clinical Social Worker Initiating Note:  Nicolis Boody, MSW, LCSW Date/Time: Initiated:  02/24/20/0945     Child's Name:  Kaitlyn Frank   Biological Parents:  Mother, Father (FOB, Kaitlyn Frank, 06/29/1993)   Need for Interpreter:  None   Reason for Referral:  Late or No Prenatal Care    Address:  [address] [place] [address]    Phone number:  [phone] (home)     Additional phone number: none offered  Household Members/Support Persons (HM/SP):   Household Member/Support Person 1, Household Member/Support Person 2, Household Member/Support Person 3, Household Member/Support Person 4   HM/SP Name Relationship DOB or Age  HM/SP -1 Kaitlyn Frank mom 55  HM/SP -2 Kaitlyn Frank dad 65  HM/SP -3 Kaitlyn Frank sister 22  HM/SP -4 Kaitlyn Frank daughter 11/20/2016  HM/SP -5        HM/SP -6        HM/SP -7        HM/SP -8          Natural Supports (not living in the home):      Professional Supports: None   Employment: Unemployed   Type of Work:     Education:  Some College   Homebound arranged:    Financial Resources:  Medicaid   Other Resources:  Food Stamps , WIC   Cultural/Religious Considerations Which May Impact Care:  none stated  Strengths:  Ability to meet basic needs , Home prepared for child , Pediatrician chosen   Psychotropic Medications:         Pediatrician:    [place] area  Pediatrician List:   [place] Other ([facility])  [place]    [place]    [place]    [place]    [place]      Pediatrician Fax Number:    Risk Factors/Current Problems:  None   Cognitive State:  Linear Thinking , Alert , Able to Concentrate    Mood/Affect:  Happy , Interested , Comfortable , Relaxed    CSW Assessment: CSW received consult  for late and limited prenatal care.  CSW met with MOB bat bedside to offer support and complete assessment. On arrival, CSW introduced self and stated purpose for visit. MOB's sister Kaitlyn and infant Kaitlyn were present, however, after PPD/A and SIDS education, Kaitlyn left room to offer MOB privacy during assessment. MOB was very pleasant and engaged during visit.   CSW provided education regarding the baby blues period vs. perinatal mood disorders, discussed treatment and gave resources for mental health follow up if concerns arise.  CSW recommends self-evaluation during the postpartum time period using the New Mom Checklist from Postpartum Progress and encouraged MOB and Kaitlyn to contact a medical professional if symptoms are noted at any time. MOB and Kaitlyn stated understanding and denied any questions. MOB denied any postpartum depression or anxiety history.    CSW provided review of Sudden Infant Death Syndrome (SIDS) precautions. MOB and Kaitlyn stated understanding and denied any questions. MOB confirmed having all needed items for baby including car seat and bassinet for baby's safe sleep.   During assessment, MOB confirmed having prenatal care while in [place],however, when she returned to the states she did nit bring records with her. CSW informed MOB of hospital's infant drug screen policy as it related to LPNC,   infant's negative UDS results, pending CDS results, and CPS report if warranted. MOB stated understanding and denied any questions or concerns. MOB denied any substance use history or behavior health dx. MOB denied any SI, HI, or domestic violence. MOB denied any CPS hx regarding other child, Basuma Frank, Nov 20, 2016. MOB identified parents and sister as support. MOB declined any additional support or resources.     CSW identifies no further need for intervention and no barriers to discharge at this time.  CSW Plan/Description:  No Further Intervention Required/No Barriers to Discharge, Sudden  Infant Death Syndrome (SIDS) Education, Perinatal Mood and Anxiety Disorder (PMADs) Education, Hospital Drug Screen Policy Information, CSW Will Continue to Monitor Umbilical Cord Tissue Drug Screen Results and Make Report if Warranted    Ramesha Poster D. Sherrill Mckamie, MSW, LCSW Clinical Social Worker [phone] 02/24/2020, 11:20 AM  

## 2020-02-26 ENCOUNTER — Encounter: Payer: Self-pay | Admitting: Family Medicine

## 2020-02-26 ENCOUNTER — Encounter: Payer: Medicaid Other | Admitting: Family Medicine

## 2020-02-29 ENCOUNTER — Ambulatory Visit: Payer: Medicaid Other

## 2020-03-01 ENCOUNTER — Other Ambulatory Visit: Payer: Self-pay | Admitting: Lactation Services

## 2020-03-01 MED ORDER — PRENATAL PLUS 27-1 MG PO TABS: 1.0000 | ORAL_TABLET | Freq: Every day | ORAL | 11 refills | Status: DC

## 2020-03-07 ENCOUNTER — Other Ambulatory Visit: Payer: Self-pay

## 2020-03-07 ENCOUNTER — Ambulatory Visit (INDEPENDENT_AMBULATORY_CARE_PROVIDER_SITE_OTHER): Payer: Medicaid Other | Admitting: *Deleted

## 2020-03-07 VITALS — BP 95/65 | HR 105 | Ht 62.0 in | Wt 116.6 lb

## 2020-03-07 DIAGNOSIS — Z789 Other specified health status: Secondary | ICD-10-CM

## 2020-03-07 DIAGNOSIS — Z87440 Personal history of urinary (tract) infections: Secondary | ICD-10-CM

## 2020-03-07 MED ORDER — PRENATAL PLUS 27-1 MG PO TABS: 1.0000 | ORAL_TABLET | Freq: Every day | ORAL | 11 refills | Status: DC

## 2020-03-07 NOTE — Progress Notes (Signed)
Here to give urine for urine culture. Had UTI in August and doctor recommended recheck urine culture in 3-4 weeks. Also had vaginal birth 02/23/20. Today states no pain with urination but c/o pressure at times. Clean catch urine obtained and sent. Lameshia Hypolite,RN

## 2020-03-09 LAB — URINE CULTURE: Organism ID, Bacteria: NO GROWTH

## 2020-03-12 ENCOUNTER — Inpatient Hospital Stay (HOSPITAL_COMMUNITY): Admit: 2020-03-12 | Payer: Self-pay

## 2020-03-28 ENCOUNTER — Encounter: Payer: Self-pay | Admitting: Obstetrics and Gynecology

## 2020-03-28 ENCOUNTER — Ambulatory Visit (INDEPENDENT_AMBULATORY_CARE_PROVIDER_SITE_OTHER): Payer: Medicaid Other | Admitting: Obstetrics and Gynecology

## 2020-03-28 ENCOUNTER — Other Ambulatory Visit: Payer: Self-pay

## 2020-03-28 DIAGNOSIS — Z789 Other specified health status: Secondary | ICD-10-CM

## 2020-03-28 MED ORDER — PRENATAL PLUS 27-1 MG PO TABS: 1.0000 | ORAL_TABLET | Freq: Every day | ORAL | 3 refills | Status: DC

## 2020-03-28 NOTE — Patient Instructions (Signed)

## 2020-03-28 NOTE — Progress Notes (Signed)
Post Partum Visit Note  Kaitlyn Frank is a 24 y.o. [phone] female who presents for a postpartum visit. She is 4 weeks 6 days postpartum following a normal spontaneous vaginal delivery.  I have fully reviewed the prenatal and intrapartum course. The delivery was at 37w 3d.  Anesthesia: epidural. Postpartum course has been uncomplicated. Baby is doing well. Baby is feeding by breast. Bleeding no bleeding. Bowel function is normal. Bladder function is normal. Patient is not sexually active. Contraception method is IUD; pt still considering. Postpartum depression screening: negative.   The pregnancy intention screening data noted above was reviewed. Potential methods of contraception were discussed. The patient elected to proceed with IUD or IUS.    Edinburgh Postnatal Depression Scale - 03/28/20 1536      Edinburgh Postnatal Depression Scale:  In the Past 7 Days   I have been able to laugh and see the funny side of things. 0    I have looked forward with enjoyment to things. 0    I have blamed myself unnecessarily when things went wrong. 0    I have been anxious or worried for no good reason. 0    I have felt scared or panicky for no good reason. 0    Things have been getting on top of me. 0    I have been so unhappy that I have had difficulty sleeping. 0    I have felt sad or miserable. 0    I have been so unhappy that I have been crying. 0    The thought of harming myself has occurred to me. 0    Edinburgh Postnatal Depression Scale Total 0          The following portions of the patient's history were reviewed and updated as appropriate: allergies, current medications, past family history, past medical history, past social history, past surgical history and problem list.  Review of Systems Pertinent items are noted in HPI.    Objective:  Blood pressure 101/71, pulse 93, weight 122 lb 1.6 oz (55.4 kg), currently breastfeeding.  General:  alert  Lungs: clear to  auscultation bilaterally  Heart:  regular rate and rhythm, S1, S2 normal, no murmur, click, rub or gallop  Abdomen: soft, non-tender; bowel sounds normal; no masses,  no organomegaly        Assessment:   Normal postpartum exam. Pap smear not done at today's visit.  Pap is due however patient also wants an IUD. Her husband is overseas in [place].   Plan:   Essential components of care per ACOG recommendations:  1.  Mood and well being: Patient with negative depression screening today. Reviewed local resources for support.  - Patient does not use tobacco.   2. Infant care and feeding:  -Patient currently breastmilk feeding? Yes If breastmilk feeding discussed return to work and pumping. If needed, patient was provided letter for work to allow for every 2-3 hr pumping breaks, and to be granted a private location to express breastmilk and refrigerated area to store breastmilk. Reviewed importance of draining breast regularly to support lactation. -Social determinants of health (SDOH) reviewed in EPIC.   3. Sexuality, contraception and birth spacing - Patient does not want a pregnancy in the next year.  Desired family size is 2 children.  - Reviewed forms of contraception in tiered fashion. Patient desired IUD today.   - Discussed birth spacing of 18 months  4. Sleep and fatigue -Encouraged family/partner/community support of 4  hrs of uninterrupted sleep to help with mood and fatigue  5. Physical Recovery  - Discussed patients delivery and complications - Patient had no laceration, perineal healing reviewed. Patient expressed understanding - Patient has urinary incontinence? No Patient was referred to pelvic floor PT  - Patient is safe to resume physical and sexual activity  6.  Health Maintenance - Last pap smear done 12/29/2016 and was normal with negative HPV.    7.Chronic Disease - PCP follow up  Venia Carbon, NP Center for [facility], [facility]

## 2020-04-11 ENCOUNTER — Encounter: Payer: Self-pay | Admitting: Student

## 2020-04-11 ENCOUNTER — Other Ambulatory Visit: Payer: Self-pay

## 2020-04-11 ENCOUNTER — Ambulatory Visit (INDEPENDENT_AMBULATORY_CARE_PROVIDER_SITE_OTHER): Payer: Medicaid Other | Admitting: Student

## 2020-04-11 ENCOUNTER — Other Ambulatory Visit (HOSPITAL_COMMUNITY)
Admission: RE | Admit: 2020-04-11 | Discharge: 2020-04-11 | Disposition: A | Discharge status: Home / Self Care | Payer: Medicaid Other | Source: Ambulatory Visit | Attending: Student | Admitting: Student

## 2020-04-11 VITALS — BP 108/76 | HR 86 | Ht 62.0 in | Wt 127.5 lb

## 2020-04-11 DIAGNOSIS — Z30430 Encounter for insertion of intrauterine contraceptive device: Secondary | ICD-10-CM

## 2020-04-11 DIAGNOSIS — Z01419 Encounter for gynecological examination (general) (routine) without abnormal findings: Secondary | ICD-10-CM | POA: Insufficient documentation

## 2020-04-11 DIAGNOSIS — Z3040 Encounter for surveillance of contraceptives, unspecified: Secondary | ICD-10-CM

## 2020-04-11 DIAGNOSIS — Z3202 Encounter for pregnancy test, result negative: Secondary | ICD-10-CM | POA: Diagnosis not present

## 2020-04-11 LAB — POCT PREGNANCY, URINE: Preg Test, Ur: NEGATIVE

## 2020-04-11 MED ORDER — LEVONORGESTREL 19.5 MCG/DAY IU IUD: INTRAUTERINE_SYSTEM | Freq: Once | INTRAUTERINE | Status: AC

## 2020-04-12 ENCOUNTER — Encounter: Payer: Self-pay | Admitting: Student

## 2020-04-12 DIAGNOSIS — Z30430 Encounter for insertion of intrauterine contraceptive device: Secondary | ICD-10-CM | POA: Insufficient documentation

## 2020-04-12 NOTE — Progress Notes (Signed)
  History:  Ms. Emmry Hinsch Louann Hopson is a 24 y.o. G3O7564 who presents to clinic today for pap smear and IUD placement. She denies any complaints or concerns today.   The following portions of the patient's history were reviewed and updated as appropriate: allergies, current medications, family history, past medical history, social history, past surgical history and problem list.  Review of Systems:  Review of Systems  Constitutional: Negative.   Genitourinary: Negative.   Musculoskeletal: Negative.   Neurological: Negative.   Psychiatric/Behavioral: Negative.   All other systems reviewed and are negative.     Objective:  Physical Exam BP 108/76   Pulse 86   Ht 5\' 2"  (1.575 m)   Wt 127 lb 8 oz (57.8 kg)   LMP  (LMP Unknown)   BMI 23.32 kg/m  Physical Exam Constitutional:      Appearance: Normal appearance.  HENT:     Head: Normocephalic.  Cardiovascular:     Rate and Rhythm: Normal rate.  Musculoskeletal:        General: Normal range of motion.  Skin:    General: Skin is warm.  Neurological:     Mental Status: She is alert.       Labs and Imaging Results for orders placed or performed in visit on 04/11/20 (from the past 24 hour(s))  Pregnancy, urine POC     Status: None   Collection Time: 04/11/20  3:21 PM  Result Value Ref Range   Preg Test, Ur NEGATIVE NEGATIVE    No results found.   Assessment & Plan:  1. Encounter for annual routine gynecological examination  - Cytology - PAP( Pisek)  2. Encounter for surveillance of contraceptive device - levonorgestrel (LILETTA) 19.5 MCG/DAY IUD  See insertion note below.   Approximately 30 minutes of total time was spent with this patient on examination and IUD placement.   04/13/20, CNM 04/12/2020 9:39 AM   GYNECOLOGY CLINIC PROCEDURE NOTE  Parilee Musa Aidee Latimore is a 24 y.o. [phone] here for liletta IUD insertion. No GYN concerns.  Last pap smear was on 2018 and was  normal.  IUD Insertion Procedure Note Patient identified, informed consent performed, consent signed.   Discussed risks of irregular bleeding, cramping, infection, malpositioning or misplacement of the IUD outside the uterus which may require further procedure such as laparoscopy. Time out was performed.  Urine pregnancy test negative.  Speculum placed in the vagina.  Cervix visualized.  Cleaned with Betadine x 2.  Grasped anteriorly with a single tooth tenaculum.  Liletta IUD placed per manufacturer's recommendations.  Strings trimmed to 3 cm. Tenaculum was removed, good hemostasis noted.  Patient tolerated procedure well.   Patient was given post-procedure instructions.  She was advised to have backup contraception for one week.  Patient was also asked to check IUD strings periodically and follow up in 4 weeks for IUD check.

## 2020-04-16 LAB — CYTOLOGY - PAP
Chlamydia: NEGATIVE
Comment: NEGATIVE
Comment: NEGATIVE
Comment: NEGATIVE
Comment: NORMAL
Diagnosis: NEGATIVE
High risk HPV: NEGATIVE
Neisseria Gonorrhea: NEGATIVE
Trichomonas: NEGATIVE

## 2020-04-18 ENCOUNTER — Encounter: Payer: Self-pay | Admitting: *Deleted

## 2020-05-15 ENCOUNTER — Ambulatory Visit: Payer: Medicaid Other | Admitting: Student

## 2020-05-29 ENCOUNTER — Ambulatory Visit (INDEPENDENT_AMBULATORY_CARE_PROVIDER_SITE_OTHER): Payer: Medicaid Other | Admitting: Student

## 2020-05-29 ENCOUNTER — Encounter: Payer: Self-pay | Admitting: Student

## 2020-05-29 ENCOUNTER — Other Ambulatory Visit: Payer: Self-pay

## 2020-05-29 VITALS — BP 106/70 | HR 96 | Wt 131.2 lb

## 2020-05-29 DIAGNOSIS — Z30431 Encounter for routine checking of intrauterine contraceptive device: Secondary | ICD-10-CM | POA: Diagnosis not present

## 2020-05-29 NOTE — Progress Notes (Signed)
  History:  Kaitlyn Frank is a 24 y.o. [id number] who presents to clinic today for IUD string check.   The following portions of the patient's history were reviewed and updated as appropriate: allergies, current medications, family history, past medical history, social history, past surgical history and problem list.  Review of Systems:  Review of Systems  Constitutional: Negative.   HENT: Negative.   Cardiovascular: Negative.   Skin: Negative.   Neurological: Negative.   Psychiatric/Behavioral: Negative.       Objective:  Physical Exam BP 106/70   Pulse 96   Wt 131 lb 3.2 oz (59.5 kg)   BMI 24.00 kg/m  Physical Exam Constitutional:      Appearance: Normal appearance.  HENT:     Head: Normocephalic.  Genitourinary:    General: Normal vulva.     Vagina: No vaginal discharge.     Comments: NEFG; IUD strings visualized x 2. No CMT. No vaginal discharge.  Neurological:     Mental Status: She is alert.       Labs and Imaging No results found for this or any previous visit (from the past 24 hour(s)).  No results found.   Assessment & Plan:   1. IUD check up    Patient doing well, pap UTD. She can return in one year for a well-woman exam.   Approximately 15 minutes of total time was spent with this patient on counseling and care.   Marylene Land, CNM 05/29/2020 3:17 PM

## 2020-07-29 ENCOUNTER — Encounter: Payer: Self-pay | Admitting: General Practice

## 2020-08-27 ENCOUNTER — Telehealth: Payer: Self-pay | Admitting: *Deleted

## 2020-08-27 NOTE — Telephone Encounter (Signed)
Pt left VM message stating that she would like an appointment. Her breasts are irritated and peeling.   3/1  2355  I returned pt's call and left VM message stating that we can see her in the office today @ 1:35 pm. She may call back if she has additional questions.

## 2021-06-29 NOTE — L&D Delivery Note (Signed)
Delivery Note  26 y.o. G3P2002 at [redacted]w[redacted]d admitted for IOL due to FGR.   At 9:58 PM a viable female was delivered via Vaginal, Spontaneous (Presentation: Left Occiput Anterior).  APGAR: 9, 9; weight  TBD   Placenta status: Spontaneous, Intact.  Cord: 3 vessels with the following complications: None.  Cord pH: not collected.  Anesthesia: Epidural Episiotomy: None Lacerations: None Suture Repair:  none Est. Blood Loss (mL): 157cc  Mom to postpartum.  Baby to Couplet care / Skin to Skin.  Sheppard Evens MD MPH OB Fellow, Faculty Practice [facility], Center for [facility] 06/12/2022

## 2022-05-07 ENCOUNTER — Ambulatory Visit (INDEPENDENT_AMBULATORY_CARE_PROVIDER_SITE_OTHER): Payer: Medicaid Other

## 2022-05-07 ENCOUNTER — Encounter (INDEPENDENT_AMBULATORY_CARE_PROVIDER_SITE_OTHER): Payer: Self-pay

## 2022-05-07 ENCOUNTER — Other Ambulatory Visit: Payer: Medicaid Other

## 2022-05-07 VITALS — BP 110/59 | HR 102

## 2022-05-07 DIAGNOSIS — Z3493 Encounter for supervision of normal pregnancy, unspecified, third trimester: Secondary | ICD-10-CM

## 2022-05-07 NOTE — Progress Notes (Signed)
Possible Pregnancy  Here today for pregnancy confirmation. UPT in office today is positive. Pt reports first positive home UPT on April 2023. Reviewed dating with patient:   LMP: 09/09/2021 EDD: 06/16/2022 34w 2d today  OB history reviewed. Reviewed medications and allergies with patient. Recommended pt begin prenatal vitamin and schedule prenatal care. OB labs drawn today. Anatomy US scheduled. Pt has initial prenatal appointment scheduled for 05/13/2022. Pt aware of appointment. Pt verbalized understanding and denied further questions.   Janeece Agee, RN 05/07/2022  12:26 PM

## 2022-05-07 NOTE — Addendum Note (Signed)
Addended by: Nira Retort D on: 05/07/2022 12:55 PM   Modules accepted: Orders

## 2022-05-08 LAB — CBC/D/PLT+RPR+RH+ABO+RUBIGG...
Antibody Screen: NEGATIVE
Basophils Absolute: 0 10*3/uL (ref 0.0–0.2)
Basos: 0 %
EOS (ABSOLUTE): 0.1 10*3/uL (ref 0.0–0.4)
Eos: 1 %
HCV Ab: NONREACTIVE
HIV Screen 4th Generation wRfx: NONREACTIVE
Hematocrit: 30.3 % — ABNORMAL LOW (ref 34.0–46.6)
Hemoglobin: 10.1 g/dL — ABNORMAL LOW (ref 11.1–15.9)
Hepatitis B Surface Ag: NEGATIVE
Immature Grans (Abs): 0.1 10*3/uL (ref 0.0–0.1)
Immature Granulocytes: 1 %
Lymphocytes Absolute: 2.3 10*3/uL (ref 0.7–3.1)
Lymphs: 30 %
MCH: 30.7 pg (ref 26.6–33.0)
MCHC: 33.3 g/dL (ref 31.5–35.7)
MCV: 92 fL (ref 79–97)
Monocytes Absolute: 0.5 10*3/uL (ref 0.1–0.9)
Monocytes: 7 %
Neutrophils Absolute: 5 10*3/uL (ref 1.4–7.0)
Neutrophils: 61 %
Platelets: 270 10*3/uL (ref 150–450)
RBC: 3.29 x10E6/uL — ABNORMAL LOW (ref 3.77–5.28)
RDW: 11.9 % (ref 11.7–15.4)
RPR Ser Ql: NONREACTIVE
Rh Factor: POSITIVE
Rubella Antibodies, IGG: 20.8 index (ref >0.99)
WBC: 7.9 10*3/uL (ref 3.4–10.8)

## 2022-05-08 LAB — HEMOGLOBIN A1C
Est. average glucose Bld gHb Est-mCnc: 117 mg/dL
Hgb A1c MFr Bld: 5.7 % — ABNORMAL HIGH (ref 4.8–5.6)

## 2022-05-08 LAB — HCV INTERPRETATION

## 2022-05-09 LAB — URINE CULTURE, OB REFLEX: Organism ID, Bacteria: NO GROWTH

## 2022-05-09 LAB — CULTURE, OB URINE

## 2022-05-11 ENCOUNTER — Encounter: Payer: Self-pay | Admitting: Family Medicine

## 2022-05-11 DIAGNOSIS — R7309 Other abnormal glucose: Secondary | ICD-10-CM | POA: Insufficient documentation

## 2022-05-11 DIAGNOSIS — O99019 Anemia complicating pregnancy, unspecified trimester: Secondary | ICD-10-CM | POA: Insufficient documentation

## 2022-05-11 DIAGNOSIS — R7303 Prediabetes: Secondary | ICD-10-CM | POA: Insufficient documentation

## 2022-05-13 ENCOUNTER — Ambulatory Visit (INDEPENDENT_AMBULATORY_CARE_PROVIDER_SITE_OTHER): Payer: Medicaid Other | Admitting: Certified Nurse Midwife

## 2022-05-13 ENCOUNTER — Encounter: Payer: Self-pay | Admitting: Certified Nurse Midwife

## 2022-05-13 ENCOUNTER — Other Ambulatory Visit: Payer: Self-pay | Admitting: *Deleted

## 2022-05-13 ENCOUNTER — Other Ambulatory Visit: Payer: Medicaid Other

## 2022-05-13 VITALS — BP 106/72 | HR 96 | Wt 122.6 lb

## 2022-05-13 DIAGNOSIS — Z3A35 35 weeks gestation of pregnancy: Secondary | ICD-10-CM

## 2022-05-13 DIAGNOSIS — O0993 Supervision of high risk pregnancy, unspecified, third trimester: Secondary | ICD-10-CM

## 2022-05-13 DIAGNOSIS — R7303 Prediabetes: Secondary | ICD-10-CM

## 2022-05-13 DIAGNOSIS — Z23 Encounter for immunization: Secondary | ICD-10-CM | POA: Diagnosis not present

## 2022-05-13 DIAGNOSIS — O99013 Anemia complicating pregnancy, third trimester: Secondary | ICD-10-CM | POA: Diagnosis not present

## 2022-05-13 DIAGNOSIS — O0933 Supervision of pregnancy with insufficient antenatal care, third trimester: Secondary | ICD-10-CM

## 2022-05-13 NOTE — Progress Notes (Signed)
History:   Kaitlyn Frank is a 26 y.o. G3P2002 at [redacted]w[redacted]d by LMP being seen today for her first obstetrical visit.  Her obstetrical history is significant for  elevated A1C but otherwise two previous uncomplicated pregnancies . Patient does intend to breast feed. Pregnancy history fully reviewed.  Patient reports no complaints.      HISTORY: OB History  Gravida Para Term Preterm AB Living  3 2 2  0 0 2  SAB IAB Ectopic Multiple Live Births  0 0 0 0 2    # Outcome Date GA Lbr Len/2nd Weight Sex Delivery Anes PTL Lv  3 Current           2 Term 02/23/20 [redacted]w[redacted]d / 00:08 6 lb 4.5 oz (2.849 kg) F Vag-Spont EPI  LIV     Name: Kaitlyn Frank     Apgar1: 9  Apgar5: 9  1 Term 11/20/16 [redacted]w[redacted]d 06:20 / 01:35 7 lb 4.2 oz (3.295 kg) F Vag-Spont EPI  LIV     Name: Kaitlyn Frank     Apgar1: 9  Apgar5: 9    Last pap smear was done 2021 and was normal  Past Medical History:  Diagnosis Date   Medical history non-contributory    Past Surgical History:  Procedure Laterality Date   NO PAST SURGERIES     Family History  Problem Relation Age of Onset   Diabetes Maternal Aunt    Social History   Tobacco Use   Smoking status: Never   Smokeless tobacco: Never  Vaping Use   Vaping Use: Never used  Substance Use Topics   Alcohol use: No   Drug use: No   No Known Allergies Current Outpatient Medications on File Prior to Visit  Medication Sig Dispense Refill   ferrous sulfate 325 (65 FE) MG tablet Take 1 tablet (325 mg total) by mouth every other day. Take with vitamin C 60 tablet 0   prenatal vitamin w/FE, FA (PRENATAL 1 + 1) 27-1 MG TABS tablet Take 1 tablet by mouth daily at 12 noon. 90 tablet 3   acetaminophen (TYLENOL) 500 MG tablet Take 2 tablets (1,000 mg total) by mouth every 6 (six) hours as needed for mild pain or moderate pain (for pain scale < 4). (Patient not taking: Reported on 03/07/2020) 60 tablet 0   vitamin C (ASCORBIC ACID) 250 MG tablet Take 1 tablet  (250 mg total) by mouth every other day. Take with iron. (Patient not taking: Reported on 04/11/2020) 30 tablet 0   No current facility-administered medications on file prior to visit.    Review of Systems Pertinent items noted in HPI and remainder of comprehensive ROS otherwise negative. Physical Exam:   Vitals:   05/13/22 0911  BP: 106/72  Pulse: 96  Weight: 122 lb 9.6 oz (55.6 kg)   Fetal Heart Rate (bpm): 137  Constitutional: Well-developed, well-nourished pregnant female in no acute distress.  HEENT: PERRLA Skin: normal color and turgor, no rash Cardiovascular: normal rate & rhythm, warm and well perfused Respiratory: normal effort, no problems with respiration noted GI: Abd soft, non-tender, gravid appropriate for gestational age MS: Extremities nontender, no edema, normal ROM Neurologic: Alert and oriented x 4.  GU: no CVA tenderness Pelvic: exam deferred  Assessment:  1. Supervision of high risk pregnancy in third trimester - Doing well, feeling regular and vigorous fetal movement  - Tdap vaccine greater than or equal to 7yo IM  2. [redacted] weeks gestation of pregnancy - Routine  OB care including anticipatory guidance re GBS test at next visit - Tdap vaccine greater than or equal to 7yo IM  3. Anemia during pregnancy in third trimester - Trying to remember to take daily iron pill but sometimes causes stomach upset. Recommended food based supplement (Blood Builder)  4. Prediabetes - Completed 2hr GTT today  5. Need for Tdap vaccination - Tdap vaccine greater than or equal to 7yo IM  6. Late prenatal care in third trimester - Working on her husband's VISA which is why she lives in the [place] for 22mo around having a baby but otherwise goes back to [place] to be with him. Currently there is a huge paperwork delay due to the ongoing wars in [place] and [place] - he is unlikely to make it here for the delivery. Pt has good familial support otherwise.  7. Initial obstetric visit  in third trimester - Initial labs reviewed - Continue prenatal vitamins. - Problem list reviewed and updated. - Unable to complete genetic testing due to lateness of pregnancy, declined. - Ultrasound discussed; fetal anatomic survey:  scheduled for 06/01/22 . - Anticipatory guidance about prenatal visits given including labs, ultrasounds, and testing. - Discussed usage of Babyscripts and virtual visits as additional source of managing and completing prenatal visits in midst of coronavirus and pandemic.   - Encouraged to complete MyChart Registration for her ability to review results, send requests, and have questions addressed.  - The nature of [facility] with multiple MDs and Advanced Practice Providers was explained to patient; also emphasized that residents, students are part of our team. - Routine obstetric precautions reviewed. Encouraged to seek out care at office or emergency room [facility] preferred) for urgent and/or emergent concerns.  Return in about 1 week (around 05/20/2022) for LOB/GBS.     Edd Arbour, MSN, CNM, IBCLC Certified Nurse Midwife, [facility]

## 2022-05-14 LAB — CBC
Hematocrit: 33.2 % — ABNORMAL LOW (ref 34.0–46.6)
Hemoglobin: 11 g/dL — ABNORMAL LOW (ref 11.1–15.9)
MCH: 30.1 pg (ref 26.6–33.0)
MCHC: 33.1 g/dL (ref 31.5–35.7)
MCV: 91 fL (ref 79–97)
Platelets: 275 10*3/uL (ref 150–450)
RBC: 3.66 x10E6/uL — ABNORMAL LOW (ref 3.77–5.28)
RDW: 11.9 % (ref 11.7–15.4)
WBC: 7.4 10*3/uL (ref 3.4–10.8)

## 2022-05-14 LAB — GLUCOSE TOLERANCE, 2 HOURS W/ 1HR
Glucose, 1 hour: 132 mg/dL (ref 70–179)
Glucose, 2 hour: 97 mg/dL (ref 70–152)
Glucose, Fasting: 81 mg/dL (ref 70–91)

## 2022-05-14 LAB — RPR: RPR Ser Ql: NONREACTIVE

## 2022-05-14 LAB — HIV ANTIBODY (ROUTINE TESTING W REFLEX): HIV Screen 4th Generation wRfx: NONREACTIVE

## 2022-05-20 ENCOUNTER — Ambulatory Visit: Payer: Medicaid Other | Attending: Family Medicine

## 2022-05-20 ENCOUNTER — Ambulatory Visit (INDEPENDENT_AMBULATORY_CARE_PROVIDER_SITE_OTHER): Payer: Medicaid Other | Admitting: Certified Nurse Midwife

## 2022-05-20 ENCOUNTER — Encounter: Payer: Self-pay | Admitting: *Deleted

## 2022-05-20 ENCOUNTER — Ambulatory Visit: Payer: Medicaid Other | Admitting: *Deleted

## 2022-05-20 ENCOUNTER — Other Ambulatory Visit: Payer: Self-pay | Admitting: *Deleted

## 2022-05-20 ENCOUNTER — Other Ambulatory Visit: Payer: Self-pay | Admitting: Family Medicine

## 2022-05-20 ENCOUNTER — Other Ambulatory Visit: Payer: Self-pay

## 2022-05-20 ENCOUNTER — Other Ambulatory Visit (HOSPITAL_COMMUNITY)
Admission: RE | Admit: 2022-05-20 | Discharge: 2022-05-20 | Disposition: A | Discharge status: Home / Self Care | Payer: Medicaid Other | Source: Ambulatory Visit | Attending: Certified Nurse Midwife | Admitting: Certified Nurse Midwife

## 2022-05-20 VITALS — BP 94/60 | HR 91

## 2022-05-20 VITALS — BP 103/65 | HR 120 | Wt 124.7 lb

## 2022-05-20 DIAGNOSIS — Z3493 Encounter for supervision of normal pregnancy, unspecified, third trimester: Secondary | ICD-10-CM

## 2022-05-20 DIAGNOSIS — Z3A36 36 weeks gestation of pregnancy: Secondary | ICD-10-CM | POA: Diagnosis not present

## 2022-05-20 DIAGNOSIS — R7309 Other abnormal glucose: Secondary | ICD-10-CM

## 2022-05-20 DIAGNOSIS — O365931 Maternal care for other known or suspected poor fetal growth, third trimester, fetus 1: Secondary | ICD-10-CM | POA: Diagnosis not present

## 2022-05-20 DIAGNOSIS — O0933 Supervision of pregnancy with insufficient antenatal care, third trimester: Secondary | ICD-10-CM

## 2022-05-20 DIAGNOSIS — O365930 Maternal care for other known or suspected poor fetal growth, third trimester, not applicable or unspecified: Secondary | ICD-10-CM | POA: Insufficient documentation

## 2022-05-20 DIAGNOSIS — Z363 Encounter for antenatal screening for malformations: Secondary | ICD-10-CM | POA: Diagnosis not present

## 2022-05-20 DIAGNOSIS — O99013 Anemia complicating pregnancy, third trimester: Secondary | ICD-10-CM | POA: Diagnosis not present

## 2022-05-20 DIAGNOSIS — O321XX0 Maternal care for breech presentation, not applicable or unspecified: Secondary | ICD-10-CM | POA: Diagnosis not present

## 2022-05-20 NOTE — Procedures (Signed)
Kaitlyn Frank January 06, 1996 [redacted]w[redacted]d  Fetus A Non-Stress Test Interpretation for 05/20/22  Indication: IUGR  Fetal Heart Rate A Mode: External Baseline Rate (A): 135 bpm Variability: Moderate Accelerations: 15 x 15 Decelerations: None Multiple birth?: No  Uterine Activity Mode: Palpation, Toco Contraction Frequency (min): UI Contraction Quality: Mild  Interpretation (Fetal Testing) Nonstress Test Interpretation: Reactive Comments: Dr. Darra Lis reviewed tracing.

## 2022-05-21 LAB — GC/CHLAMYDIA PROBE AMP (~~LOC~~) NOT AT ARMC
Chlamydia: NEGATIVE
Comment: NEGATIVE
Comment: NORMAL
Neisseria Gonorrhea: NEGATIVE

## 2022-05-23 LAB — CULTURE, BETA STREP (GROUP B ONLY): Strep Gp B Culture: POSITIVE — AB

## 2022-05-25 ENCOUNTER — Encounter: Payer: Self-pay | Admitting: Certified Nurse Midwife

## 2022-05-25 DIAGNOSIS — O365990 Maternal care for other known or suspected poor fetal growth, unspecified trimester, not applicable or unspecified: Secondary | ICD-10-CM | POA: Insufficient documentation

## 2022-05-25 DIAGNOSIS — R8271 Bacteriuria: Secondary | ICD-10-CM | POA: Insufficient documentation

## 2022-05-25 DIAGNOSIS — O99820 Streptococcus B carrier state complicating pregnancy: Secondary | ICD-10-CM | POA: Insufficient documentation

## 2022-05-25 DIAGNOSIS — O321XX0 Maternal care for breech presentation, not applicable or unspecified: Secondary | ICD-10-CM | POA: Insufficient documentation

## 2022-05-25 NOTE — Progress Notes (Signed)
   PRENATAL VISIT NOTE  Subjective:  Kaitlyn Frank is a 26 y.o. G3P2002 at [redacted]w[redacted]d being seen today for ongoing prenatal care.  She is currently monitored for the following issues for this low-risk pregnancy and has Supervision of low-risk pregnancy; Iron deficiency anemia; Prediabetes; and Anemia in pregnancy on their problem list.  Patient reports no complaints.  Contractions: Not present. Vag. Bleeding: None.  Movement: Present. Denies leaking of fluid.   The following portions of the patient's history were reviewed and updated as appropriate: allergies, current medications, past family history, past medical history, past social history, past surgical history and problem list.   Objective:   Vitals:   05/20/22 1545  BP: 103/65  Pulse: (!) 120  Weight: 124 lb 11.2 oz (56.6 kg)    Fetal Status: Fetal Heart Rate (bpm): 134   Movement: Present     General:  Alert, oriented and cooperative. Patient is in no acute distress.  Skin: Skin is warm and dry. No rash noted.   Cardiovascular: Normal heart rate noted  Respiratory: Normal respiratory effort, no problems with respiration noted  Abdomen: Soft, gravid, appropriate for gestational age.  Pain/Pressure: Absent     Pelvic: Cervical exam deferred        Extremities: Normal range of motion.  Edema: None  Mental Status: Normal mood and affect. Normal behavior. Normal judgment and thought content.   Assessment and Plan:  Pregnancy: G3P2002 at [redacted]w[redacted]d 1. Encounter for supervision of low-risk pregnancy in third trimester - Doing well, feeling regular and vigorous fetal movement   2. [redacted] weeks gestation of pregnancy - Routine OB care  - GC/Chlamydia probe amp ([place])not at [facility] - Culture, beta strep (group b only)  3. Anemia during pregnancy in third trimester - Taking oral iron and PNV  Preterm labor symptoms and general obstetric precautions including but not limited to vaginal bleeding, contractions, leaking of fluid  and fetal movement were reviewed in detail with the patient. Please refer to After Visit Summary for other counseling recommendations.   No follow-ups on file.  Future Appointments  Date Time Provider Department Center  05/27/2022  2:10 PM Venora Maples, MD [facility]  05/27/2022  3:30 PM [facility] NURSE [facility]  05/27/2022  3:45 PM [facility] US7 [facility]  06/04/2022  4:15 PM Celedonio Savage, MD [facility]  06/11/2022 10:15 AM Warden Fillers, MD [facility]  06/18/2022  8:15 AM [facility] NST [facility]  06/18/2022 10:15 AM Warden Fillers, MD [facility]    Bernerd Limbo, CNM

## 2022-05-27 ENCOUNTER — Ambulatory Visit (INDEPENDENT_AMBULATORY_CARE_PROVIDER_SITE_OTHER): Payer: Medicaid Other | Admitting: Family Medicine

## 2022-05-27 ENCOUNTER — Ambulatory Visit: Payer: Medicaid Other | Attending: Obstetrics and Gynecology

## 2022-05-27 ENCOUNTER — Encounter: Payer: Self-pay | Admitting: Family Medicine

## 2022-05-27 ENCOUNTER — Other Ambulatory Visit: Payer: Self-pay

## 2022-05-27 ENCOUNTER — Ambulatory Visit: Payer: Medicaid Other | Admitting: *Deleted

## 2022-05-27 VITALS — BP 100/58 | HR 91

## 2022-05-27 VITALS — BP 112/71 | HR 88 | Wt 126.6 lb

## 2022-05-27 DIAGNOSIS — O0933 Supervision of pregnancy with insufficient antenatal care, third trimester: Secondary | ICD-10-CM | POA: Diagnosis not present

## 2022-05-27 DIAGNOSIS — Z3A37 37 weeks gestation of pregnancy: Secondary | ICD-10-CM

## 2022-05-27 DIAGNOSIS — O365930 Maternal care for other known or suspected poor fetal growth, third trimester, not applicable or unspecified: Secondary | ICD-10-CM | POA: Diagnosis not present

## 2022-05-27 DIAGNOSIS — R7309 Other abnormal glucose: Secondary | ICD-10-CM

## 2022-05-27 DIAGNOSIS — O321XX0 Maternal care for breech presentation, not applicable or unspecified: Secondary | ICD-10-CM

## 2022-05-27 DIAGNOSIS — Z363 Encounter for antenatal screening for malformations: Secondary | ICD-10-CM | POA: Insufficient documentation

## 2022-05-27 DIAGNOSIS — Z3493 Encounter for supervision of normal pregnancy, unspecified, third trimester: Secondary | ICD-10-CM | POA: Insufficient documentation

## 2022-05-27 DIAGNOSIS — O99013 Anemia complicating pregnancy, third trimester: Secondary | ICD-10-CM

## 2022-05-27 DIAGNOSIS — O365990 Maternal care for other known or suspected poor fetal growth, unspecified trimester, not applicable or unspecified: Secondary | ICD-10-CM

## 2022-05-27 DIAGNOSIS — O99820 Streptococcus B carrier state complicating pregnancy: Secondary | ICD-10-CM

## 2022-05-27 DIAGNOSIS — Z789 Other specified health status: Secondary | ICD-10-CM

## 2022-05-27 MED ORDER — PRENATAL PLUS 27-1 MG PO TABS: 1.0000 | ORAL_TABLET | Freq: Every day | ORAL | 3 refills | Status: AC

## 2022-05-27 MED ORDER — FERROUS SULFATE 325 (65 FE) MG PO TABS: 325.0000 mg | ORAL_TABLET | ORAL | 0 refills | Status: AC

## 2022-05-27 NOTE — Progress Notes (Signed)
   Subjective:  Kaitlyn Frank is a 26 y.o. G3P2002 at [redacted]w[redacted]d being seen today for ongoing prenatal care.  She is currently monitored for the following issues for this high-risk pregnancy and has Supervision of low-risk pregnancy; Iron deficiency anemia; Elevated hemoglobin A1c; Anemia in pregnancy; GBS (group B Streptococcus carrier), +RV culture, currently pregnant; Fetal growth restriction antepartum; and Breech presentation on their problem list.  Patient reports no complaints.  Contractions: Not present. Vag. Bleeding: None.  Movement: Present. Denies leaking of fluid.   The following portions of the patient's history were reviewed and updated as appropriate: allergies, current medications, past family history, past medical history, past social history, past surgical history and problem list. Problem list updated.  Objective:   Vitals:   05/27/22 1429  BP: 112/71  Pulse: 88  Weight: 126 lb 9.6 oz (57.4 kg)    Fetal Status: Fetal Heart Rate (bpm): 136   Movement: Present     General:  Alert, oriented and cooperative. Patient is in no acute distress.  Skin: Skin is warm and dry. No rash noted.   Cardiovascular: Normal heart rate noted  Respiratory: Normal respiratory effort, no problems with respiration noted  Abdomen: Soft, gravid, appropriate for gestational age. Pain/Pressure: Absent     Pelvic: Vag. Bleeding: None     Cervical exam deferred        Extremities: Normal range of motion.     Mental Status: Normal mood and affect. Normal behavior. Normal judgment and thought content.   Urinalysis:      Assessment and Plan:  Pregnancy: G3P2002 at [redacted]w[redacted]d  1. Encounter for supervision of low-risk pregnancy in third trimester BP and FHR normal  2. Fetal growth restriction antepartum Initial US done 05/20/2022 showed EFW 4.4%, AC 5%, AFI 15, normal dopplers Timing of delivery not yet determined by MFM, will defer scheduling until situation around fetal presentation becomes  clearer  3. Breech presentation, single or unspecified fetus Breech at last [place], per MFM note would like ECV Has repeat BPP later today, management pending that but likely to be scheduled for IOL Also counseled on planned vaginal breech vs primary cesarean  4. GBS (group B Streptococcus carrier), +RV culture, currently pregnant Ppx in labor  Term labor symptoms and general obstetric precautions including but not limited to vaginal bleeding, contractions, leaking of fluid and fetal movement were reviewed in detail with the patient. Please refer to After Visit Summary for other counseling recommendations.  Return in about 1 week (around 06/03/2022) for [facility], ob visit.   Venora Maples, MD

## 2022-06-01 ENCOUNTER — Ambulatory Visit: Payer: Medicaid Other

## 2022-06-01 ENCOUNTER — Other Ambulatory Visit: Payer: Self-pay | Admitting: *Deleted

## 2022-06-01 DIAGNOSIS — O365930 Maternal care for other known or suspected poor fetal growth, third trimester, not applicable or unspecified: Secondary | ICD-10-CM

## 2022-06-01 DIAGNOSIS — O0933 Supervision of pregnancy with insufficient antenatal care, third trimester: Secondary | ICD-10-CM

## 2022-06-04 ENCOUNTER — Ambulatory Visit: Payer: Medicaid Other | Admitting: *Deleted

## 2022-06-04 ENCOUNTER — Ambulatory Visit: Payer: Medicaid Other | Attending: Maternal & Fetal Medicine

## 2022-06-04 ENCOUNTER — Ambulatory Visit (INDEPENDENT_AMBULATORY_CARE_PROVIDER_SITE_OTHER): Payer: Medicaid Other | Admitting: Family Medicine

## 2022-06-04 VITALS — BP 98/57 | HR 82

## 2022-06-04 VITALS — BP 97/68 | HR 100 | Wt 127.6 lb

## 2022-06-04 DIAGNOSIS — Z3493 Encounter for supervision of normal pregnancy, unspecified, third trimester: Secondary | ICD-10-CM

## 2022-06-04 DIAGNOSIS — O321XX0 Maternal care for breech presentation, not applicable or unspecified: Secondary | ICD-10-CM | POA: Diagnosis not present

## 2022-06-04 DIAGNOSIS — D649 Anemia, unspecified: Secondary | ICD-10-CM | POA: Diagnosis not present

## 2022-06-04 DIAGNOSIS — Z3A38 38 weeks gestation of pregnancy: Secondary | ICD-10-CM | POA: Insufficient documentation

## 2022-06-04 DIAGNOSIS — O0933 Supervision of pregnancy with insufficient antenatal care, third trimester: Secondary | ICD-10-CM | POA: Diagnosis not present

## 2022-06-04 DIAGNOSIS — O99013 Anemia complicating pregnancy, third trimester: Secondary | ICD-10-CM | POA: Diagnosis not present

## 2022-06-04 DIAGNOSIS — O365930 Maternal care for other known or suspected poor fetal growth, third trimester, not applicable or unspecified: Secondary | ICD-10-CM

## 2022-06-04 DIAGNOSIS — O365990 Maternal care for other known or suspected poor fetal growth, unspecified trimester, not applicable or unspecified: Secondary | ICD-10-CM

## 2022-06-04 DIAGNOSIS — O99820 Streptococcus B carrier state complicating pregnancy: Secondary | ICD-10-CM

## 2022-06-04 DIAGNOSIS — R7309 Other abnormal glucose: Secondary | ICD-10-CM

## 2022-06-05 ENCOUNTER — Encounter (HOSPITAL_COMMUNITY): Payer: Self-pay | Admitting: *Deleted

## 2022-06-05 ENCOUNTER — Telehealth (HOSPITAL_COMMUNITY): Payer: Self-pay | Admitting: *Deleted

## 2022-06-05 NOTE — Progress Notes (Signed)
   PRENATAL VISIT NOTE  Subjective:  Kaitlyn Frank is a 26 y.o. G3P2002 at [redacted]w[redacted]d being seen today for ongoing prenatal care.  She is currently monitored for the following issues for this high-risk pregnancy and has Supervision of low-risk pregnancy; Iron deficiency anemia; Elevated hemoglobin A1c; Anemia in pregnancy; GBS (group B Streptococcus carrier), +RV culture, currently pregnant; Fetal growth restriction antepartum; and Breech presentation on their problem list.  Patient reports no bleeding, no contractions, no cramping, and no leaking.  Contractions: Not present. Vag. Bleeding: None.  Movement: Present. Denies leaking of fluid.   The following portions of the patient's history were reviewed and updated as appropriate: allergies, current medications, past family history, past medical history, past social history, past surgical history and problem list.   Objective:   Vitals:   06/04/22 1631  BP: 97/68  Pulse: 100  Weight: 127 lb 9.6 oz (57.9 kg)    Fetal Status: Fetal Heart Rate (bpm): 156   Movement: Present     General:  Alert, oriented and cooperative. Patient is in no acute distress.  Skin: Skin is warm and dry. No rash noted.   Cardiovascular: Normal heart rate noted  Respiratory: Normal respiratory effort, no problems with respiration noted  Abdomen: Soft, gravid, appropriate for gestational age.  Pain/Pressure: Absent     Pelvic: Cervical exam performed in the presence of a chaperone      1.5/70/-2  Extremities: Normal range of motion.     Mental Status: Normal mood and affect. Normal behavior. Normal judgment and thought content.   Assessment and Plan:  Pregnancy: G3P2002 at [redacted]w[redacted]d 1. Encounter for supervision of low-risk pregnancy in third trimester Continue routine prenatal care  2. Fetal growth restriction antepartum Initial ultrasound in 05/20/2022 showed EFW 4.4%, AC 5%, AFI 15 with normal Dopplers.  Saw MFM today who recommended induction of labor at  39 weeks.  She has been scheduled for this.  3. Breech presentation, single or unspecified fetus Cephalic on ultrasound today  4. GBS (group B Streptococcus carrier), +RV culture, currently pregnant Will need antibiotics during labor  5. [redacted] weeks gestation of pregnancy   Term labor symptoms and general obstetric precautions including but not limited to vaginal bleeding, contractions, leaking of fluid and fetal movement were reviewed in detail with the patient. Please refer to After Visit Summary for other counseling recommendations.   No follow-ups on file.  Future Appointments  Date Time Provider Department Center  06/11/2022  6:30 AM [facility] SCHED ROOM [facility] None    Celedonio Savage, MD

## 2022-06-05 NOTE — Telephone Encounter (Signed)
Preadmission screen  

## 2022-06-10 ENCOUNTER — Other Ambulatory Visit: Payer: Self-pay | Admitting: Advanced Practice Midwife

## 2022-06-10 DIAGNOSIS — O365990 Maternal care for other known or suspected poor fetal growth, unspecified trimester, not applicable or unspecified: Secondary | ICD-10-CM

## 2022-06-11 ENCOUNTER — Inpatient Hospital Stay (HOSPITAL_COMMUNITY): Payer: Medicaid Other

## 2022-06-11 ENCOUNTER — Inpatient Hospital Stay (HOSPITAL_COMMUNITY)
Admission: AD | Admit: 2022-06-11 | Discharge: 2022-06-14 | DRG: 807 | Disposition: A | Discharge status: Home / Self Care | Payer: Medicaid Other | Attending: Obstetrics and Gynecology | Admitting: Obstetrics and Gynecology

## 2022-06-11 ENCOUNTER — Ambulatory Visit: Payer: Medicaid Other

## 2022-06-11 ENCOUNTER — Other Ambulatory Visit: Payer: Self-pay

## 2022-06-11 ENCOUNTER — Encounter (HOSPITAL_COMMUNITY): Payer: Self-pay | Admitting: Obstetrics and Gynecology

## 2022-06-11 ENCOUNTER — Encounter: Payer: Self-pay | Admitting: Obstetrics and Gynecology

## 2022-06-11 DIAGNOSIS — Z3A39 39 weeks gestation of pregnancy: Secondary | ICD-10-CM

## 2022-06-11 DIAGNOSIS — O365930 Maternal care for other known or suspected poor fetal growth, third trimester, not applicable or unspecified: Principal | ICD-10-CM | POA: Diagnosis present

## 2022-06-11 DIAGNOSIS — O99019 Anemia complicating pregnancy, unspecified trimester: Secondary | ICD-10-CM | POA: Diagnosis present

## 2022-06-11 DIAGNOSIS — Z3490 Encounter for supervision of normal pregnancy, unspecified, unspecified trimester: Secondary | ICD-10-CM

## 2022-06-11 DIAGNOSIS — O9902 Anemia complicating childbirth: Secondary | ICD-10-CM | POA: Diagnosis present

## 2022-06-11 DIAGNOSIS — O365990 Maternal care for other known or suspected poor fetal growth, unspecified trimester, not applicable or unspecified: Secondary | ICD-10-CM | POA: Diagnosis present

## 2022-06-11 DIAGNOSIS — O99824 Streptococcus B carrier state complicating childbirth: Secondary | ICD-10-CM | POA: Diagnosis present

## 2022-06-11 DIAGNOSIS — O365931 Maternal care for other known or suspected poor fetal growth, third trimester, fetus 1: Secondary | ICD-10-CM | POA: Diagnosis not present

## 2022-06-11 MED ORDER — FENTANYL CITRATE (PF) 100 MCG/2ML IJ SOLN: 100.0000 ug | INTRAMUSCULAR | Status: DC | PRN

## 2022-06-11 MED ORDER — MISOPROSTOL 50MCG HALF TABLET
50.0000 ug | ORAL_TABLET | ORAL | Status: DC | PRN
  Administered 2022-06-12: 50 ug via ORAL
  Filled 2022-06-11: qty 1

## 2022-06-11 MED ORDER — ONDANSETRON HCL 4 MG/2ML IJ SOLN: 4.0000 mg | Freq: Four times a day (QID) | INTRAMUSCULAR | Status: DC | PRN

## 2022-06-11 MED ORDER — SODIUM CHLORIDE 0.9 % IV SOLN
5.0000 10*6.[IU] | Freq: Once | INTRAVENOUS | Status: AC
  Administered 2022-06-11: 5 10*6.[IU] via INTRAVENOUS
  Filled 2022-06-11: qty 5

## 2022-06-11 MED ORDER — LACTATED RINGERS IV SOLN: 500.0000 mL | INTRAVENOUS | Status: DC | PRN

## 2022-06-11 MED ORDER — OXYTOCIN BOLUS FROM INFUSION
333.0000 mL | Freq: Once | INTRAVENOUS | Status: AC
  Administered 2022-06-12: 333 mL via INTRAVENOUS

## 2022-06-11 MED ORDER — LACTATED RINGERS IV SOLN: INTRAVENOUS | Status: DC

## 2022-06-11 MED ORDER — OXYCODONE-ACETAMINOPHEN 5-325 MG PO TABS: 2.0000 | ORAL_TABLET | ORAL | Status: DC | PRN

## 2022-06-11 MED ORDER — SOD CITRATE-CITRIC ACID 500-334 MG/5ML PO SOLN: 30.0000 mL | ORAL | Status: DC | PRN

## 2022-06-11 MED ORDER — LIDOCAINE HCL (PF) 1 % IJ SOLN: 30.0000 mL | INTRAMUSCULAR | Status: DC | PRN

## 2022-06-11 MED ORDER — OXYTOCIN-SODIUM CHLORIDE 30-0.9 UT/500ML-% IV SOLN
2.5000 [IU]/h | INTRAVENOUS | Status: DC
  Filled 2022-06-11: qty 500

## 2022-06-11 MED ORDER — OXYCODONE-ACETAMINOPHEN 5-325 MG PO TABS: 1.0000 | ORAL_TABLET | ORAL | Status: DC | PRN

## 2022-06-11 MED ORDER — TERBUTALINE SULFATE 1 MG/ML IJ SOLN: 0.2500 mg | Freq: Once | INTRAMUSCULAR | Status: DC | PRN

## 2022-06-11 MED ORDER — ACETAMINOPHEN 325 MG PO TABS
650.0000 mg | ORAL_TABLET | ORAL | Status: DC | PRN
  Administered 2022-06-13: 650 mg via ORAL
  Filled 2022-06-11: qty 2

## 2022-06-11 MED ORDER — PENICILLIN G POT IN DEXTROSE 60000 UNIT/ML IV SOLN
3.0000 10*6.[IU] | INTRAVENOUS | Status: DC
  Administered 2022-06-12 (×5): 3 10*6.[IU] via INTRAVENOUS
  Filled 2022-06-11 (×5): qty 50

## 2022-06-11 NOTE — H&P (Signed)
Kaitlyn Frank is a 26 y.o. female [phone] with IUP at [redacted]w[redacted]d presenting for IOL for FGR. P[facility]are at [facility]  Prenatal History/Complications:  Term SVD 6# 4oz Term SVD 7 # 2oz   Past Medical History: Past Medical History:  Diagnosis Date   Medical history non-contributory     Past Surgical History: Past Surgical History:  Procedure Laterality Date   NO PAST SURGERIES      Obstetrical History: OB History     Gravida  3   Para  2   Term  2   Preterm  0   AB  0   Living  2      SAB  0   IAB  0   Ectopic  0   Multiple  0   Live Births  2           Social History: Social History   Socioeconomic History   Marital status: Married    Spouse name: Not on file   Number of children: Not on file   Years of education: Not on file   Highest education level: Not on file  Occupational History   Not on file  Tobacco Use   Smoking status: Never   Smokeless tobacco: Never  Vaping Use   Vaping Use: Never used  Substance and Sexual Activity   Alcohol use: No   Drug use: No   Sexual activity: Not Currently    Birth control/protection: None    Comment: Patient wants IUD  Other Topics Concern   Not on file  Social History Narrative   Not on file   Social Determinants of Health   Financial Resource Strain: Not on file  Food Insecurity: No Food Insecurity (06/11/2022)   Hunger Vital Sign    Worried About Running Out of Food in the Last Year: Never true    Ran Out of Food in the Last Year: Never true  Transportation Needs: No Transportation Needs (06/11/2022)   PRAPARE - Administrator, Civil Service (Medical): No    Lack of Transportation (Non-Medical): No  Physical Activity: Not on file  Stress: Not on file  Social Connections: Not on file    Family History: Family History  Problem Relation Age of Onset   Diabetes Maternal Aunt    Asthma Neg Hx    Cancer Neg Hx    Heart disease Neg Hx    Hypertension Neg Hx      Allergies: No Known Allergies  Medications Prior to Admission  Medication Sig Dispense Refill Last Dose   ferrous sulfate 325 (65 FE) MG tablet Take 1 tablet (325 mg total) by mouth every other day. Take with vitamin C 60 tablet 0    prenatal vitamin w/FE, FA (PRENATAL 1 + 1) 27-1 MG TABS tablet Take 1 tablet by mouth daily at 12 noon. 90 tablet 3         Review of Systems   Constitutional: Negative for fever and chills Eyes: Negative for visual disturbances Respiratory: Negative for shortness of breath, dyspnea Cardiovascular: Negative for chest pain or palpitations  Gastrointestinal: Negative for abdominal pain, vomiting, diarrhea and constipation.   Genitourinary: Negative for dysuria and urgency Musculoskeletal: Negative for back pain, joint pain, myalgias  Neurological: Negative for dizziness and headaches      Blood pressure 96/71, pulse 90, temperature 98.3 F (36.8 C), temperature source Oral, resp. rate 16, last menstrual period 09/09/2021, SpO2 100 %, currently breastfeeding. General appearance: alert, cooperative, and  no distress Lungs: normal respiratory effort Heart: regular rate and rhythm Abdomen: soft, non-tender; bowel sounds normal Extremities: Homans sign is negative, no sign of DVT DTR's 2+ Presentation: cephalic Fetal monitoring  Baseline: 120 bpm, Variability: Good {> 6 bpm), Accelerations: Reactive, and Decelerations: Absent Uterine activity  None  Dilation: 2.5 Effacement (%): 70 Exam by:: Gilberto Better CNM   Prenatal labs: ABO, Rh: O/Positive/-- (11/09 1338) Antibody: Negative (11/09 1338) Rubella: immune RPR: Non Reactive (11/15 0838)  HBsAg: Negative (11/09 1338)  HIV: Non Reactive (11/15 0321)  GBS: Positive/-- (11/22 1629)   Nursing Staff Provider  Office Location [facility] Dating  06/16/2022, by Last Menstrual Period  [facility] Model [x]  Traditional [ ]  Centering [ ]  Mom-Baby Dyad Anatomy  Normal anatomy w FGR  05/20/22  Language  English    Flu Vaccine  declined Genetic/Carrier Screen  NIPS:   not done AFP:   not done Horizon: not done  TDaP Vaccine   05/13/22 Hgb A1C or  GTT Early  Third trimester - normal  COVID Vaccine none   LAB RESULTS   Rhogam  N/a Blood Type O/Positive/-- (11/09 1338)   Baby Feeding Plan Breast  Antibody Negative (11/09 1338)  Contraception IUD  Rubella 20.80 (11/09 1338)  Circumcision Boy- YES RPR Non Reactive (11/15 0838)   Pediatrician  [facility] HBsAg Negative (11/09 1338)   Support Person parents HCVAb Non Reactive (11/09 1338)   Prenatal Classes  HIV Non Reactive (11/15 10-30-1987)     BTL Consent  GBS Positive/-- (11/22 1629) (For PCN allergy, check sensitivities)   VBAC Consent  Pap Diagnosis  Date Value Ref Range Status  04/11/2020   Final   - Negative for intraepithelial lesion or malignancy (NILM)         DME Rx [ ]  BP cuff [ ]  Weight Scale Waterbirth  [ ]  Class [ ]  Consent [ ]  CNM visit  PHQ9 & GAD7 [x]  new OB [  ] 28 weeks  [x]  36 weeks Induction  [ ]  Orders Entered [ ] Foley Y/N      Prenatal Transfer Tool  Maternal Diabetes: No Genetic Screening: Declined Maternal Ultrasounds/Referrals: IUGR Fetal Ultrasounds or other Referrals:  Referred to Materal Fetal Medicine  Maternal Substance Abuse:  No Significant Maternal Medications:  None Significant Maternal Lab Results: Group B Strep positive    No results found for this or any previous visit (from the past 24 hour(s)).  Assessment: Kaitlyn Frank is a 26 y.o. [phone] with an IUP at [redacted]w[redacted]d presenting for IOL for FGR, EFW 4.4% @ 37 weeks.  Plan: #Labor: Cytotec to start.  Will avoid double dosing d/t FGR #Pain:  Per request #FWB Cat 1 #ID: GBS: PCN  #MOF:  breast #MOC: IUD #Circ: yes   06/12/2022, 12:22 AM

## 2022-06-12 ENCOUNTER — Encounter (HOSPITAL_COMMUNITY): Payer: Self-pay | Admitting: Obstetrics and Gynecology

## 2022-06-12 ENCOUNTER — Inpatient Hospital Stay (HOSPITAL_COMMUNITY): Payer: Medicaid Other | Admitting: Anesthesiology

## 2022-06-12 DIAGNOSIS — O99824 Streptococcus B carrier state complicating childbirth: Secondary | ICD-10-CM

## 2022-06-12 DIAGNOSIS — Z3A39 39 weeks gestation of pregnancy: Secondary | ICD-10-CM

## 2022-06-12 DIAGNOSIS — O365931 Maternal care for other known or suspected poor fetal growth, third trimester, fetus 1: Secondary | ICD-10-CM

## 2022-06-12 LAB — CBC
HCT: 30.6 % — ABNORMAL LOW (ref 36.0–46.0)
Hemoglobin: 10.3 g/dL — ABNORMAL LOW (ref 12.0–15.0)
MCH: 30.8 pg (ref 26.0–34.0)
MCHC: 33.7 g/dL (ref 30.0–36.0)
MCV: 91.6 fL (ref 80.0–100.0)
Platelets: 226 10*3/uL (ref 150–400)
RBC: 3.34 MIL/uL — ABNORMAL LOW (ref 3.87–5.11)
RDW: 14.8 % (ref 11.5–15.5)
WBC: 8 10*3/uL (ref 4.0–10.5)
nRBC: 0 % (ref 0.0–0.2)

## 2022-06-12 LAB — TYPE AND SCREEN
ABO/RH(D): O POS
Antibody Screen: NEGATIVE

## 2022-06-12 LAB — RPR: RPR Ser Ql: NONREACTIVE

## 2022-06-12 MED ORDER — LACTATED RINGERS IV SOLN
500.0000 mL | Freq: Once | INTRAVENOUS | Status: AC
  Administered 2022-06-12: 500 mL via INTRAVENOUS

## 2022-06-12 MED ORDER — PHENYLEPHRINE 80 MCG/ML (10ML) SYRINGE FOR IV PUSH (FOR BLOOD PRESSURE SUPPORT): 80.0000 ug | PREFILLED_SYRINGE | INTRAVENOUS | Status: DC | PRN

## 2022-06-12 MED ORDER — ZOLPIDEM TARTRATE 5 MG PO TABS: 5.0000 mg | ORAL_TABLET | Freq: Every evening | ORAL | Status: DC | PRN

## 2022-06-12 MED ORDER — OXYTOCIN-SODIUM CHLORIDE 30-0.9 UT/500ML-% IV SOLN
1.0000 m[IU]/min | INTRAVENOUS | Status: DC
  Administered 2022-06-12: 2 m[IU]/min via INTRAVENOUS

## 2022-06-12 MED ORDER — FENTANYL-BUPIVACAINE-NACL 0.5-0.125-0.9 MG/250ML-% EP SOLN
12.0000 mL/h | EPIDURAL | Status: DC | PRN
  Administered 2022-06-12: 11.5 mL/h via EPIDURAL
  Filled 2022-06-12: qty 250

## 2022-06-12 MED ORDER — PHENYLEPHRINE 80 MCG/ML (10ML) SYRINGE FOR IV PUSH (FOR BLOOD PRESSURE SUPPORT)
80.0000 ug | PREFILLED_SYRINGE | INTRAVENOUS | Status: DC | PRN
  Filled 2022-06-12: qty 10

## 2022-06-12 MED ORDER — TERBUTALINE SULFATE 1 MG/ML IJ SOLN: 0.2500 mg | Freq: Once | INTRAMUSCULAR | Status: DC | PRN

## 2022-06-12 MED ORDER — EPHEDRINE 5 MG/ML INJ: 10.0000 mg | INTRAVENOUS | Status: DC | PRN

## 2022-06-12 MED ORDER — LIDOCAINE HCL (PF) 1 % IJ SOLN
INTRAMUSCULAR | Status: DC | PRN
  Administered 2022-06-12: 4 mL via EPIDURAL
  Administered 2022-06-12: 5 mL via EPIDURAL

## 2022-06-12 MED ORDER — DIPHENHYDRAMINE HCL 50 MG/ML IJ SOLN: 12.5000 mg | INTRAMUSCULAR | Status: DC | PRN

## 2022-06-12 NOTE — Lactation Note (Signed)
This note was copied from a baby's chart. Lactation Consultation Note  Patient Name: Kaitlyn Frank [id number] Date: 06/12/2022   Age:26 hours Mom declined Lactation services. Maternal Data    Feeding    LATCH Score                    Lactation Tools Discussed/Used    Interventions    Discharge    Consult Status Consult Status: Complete    Kaitlyn Frank 06/12/2022, 11:03 PM

## 2022-06-12 NOTE — Discharge Summary (Signed)
Postpartum Discharge Summary  Date of Service updated***     Patient Name: Kaitlyn Frank DOB: [birth year] MRN: [id number]  Date of admission: 06/11/2022 Delivery date:06/12/2022  Delivering provider: Stormy Card  Date of discharge: 06/12/2022  Admitting diagnosis: Fetal growth restriction antepartum [O36.5990] Intrauterine pregnancy: [redacted]w[redacted]d    Secondary diagnosis:  Principal Problem:   Vaginal delivery Active Problems:   Supervision of low-risk pregnancy   Anemia in pregnancy   Fetal growth restriction antepartum  Additional problems: ***    Discharge diagnosis: {DX.:23714}                                              Post partum procedures:{Postpartum procedures:23558} Augmentation: AROM, Pitocin, and Cytotec Complications: None  Hospital course: Induction of Labor With Vaginal Delivery   26y.o. yo G3P2002 at 330w3das admitted to the hospital 06/11/2022 for induction of labor.  Indication for induction:  fetal growth restriction .  Patient had an labor course complicated by*** Membrane Rupture Time/Date: 10:30 AM ,06/12/2022   Delivery Method:Vaginal, Spontaneous  Episiotomy: None  Lacerations:  None  Details of delivery can be found in separate delivery note.  Patient had a postpartum course complicated by***. Patient is discharged home 06/12/22.  Newborn Data: Birth date:06/12/2022  Birth time:9:58 PM  Gender:Female  Living status:Living  Apgars:9 ,9  Weight:   Magnesium Sulfate received: No BMZ received: No Rhophylac:N/A MMR:N/A, Rubella Immune T-DaP:Given prenatally Flu: {F{OVA:91916}ransfusion:{Transfusion received:30440034}  Physical exam  Vitals:   06/12/22 2211 06/12/22 2216 06/12/22 2231 06/12/22 2246  BP: 108/65 111/74 110/72 106/66  Pulse: 80 85 79 79  Resp:  18    Temp:      TempSrc:      SpO2:      Weight:      Height:       General: {Exam; general:21111117} Lochia: {Desc;  appropriate/inappropriate:30686::"appropriate"} Uterine Fundus: {Desc; firm/soft:30687} Incision: {Exam; incision:21111123} DVT Evaluation: {Exam; dvt:2111122} Labs: Lab Results  Component Value Date   WBC 8.0 06/12/2022   HGB 10.3 (L) 06/12/2022   HCT 30.6 (L) 06/12/2022   MCV 91.6 06/12/2022   PLT 226 06/12/2022       No data to display         Edinburgh Score:    03/28/2020    3:36 PM  Edinburgh Postnatal Depression Scale Screening Tool  I have been able to laugh and see the funny side of things. 0  I have looked forward with enjoyment to things. 0  I have blamed myself unnecessarily when things went wrong. 0  I have been anxious or worried for no good reason. 0  I have felt scared or panicky for no good reason. 0  Things have been getting on top of me. 0  I have been so unhappy that I have had difficulty sleeping. 0  I have felt sad or miserable. 0  I have been so unhappy that I have been crying. 0  The thought of harming myself has occurred to me. 0  Edinburgh Postnatal Depression Scale Total 0     After visit meds:  Allergies as of 06/12/2022   No Known Allergies   Med Rec must be completed prior to using this [facility]        Discharge home in stable condition Infant Feeding: {Baby feeding:23562} Infant Disposition:{CHL IP OB HOME  WITH SKAJGO:11572} Discharge instruction: per After Visit Summary and Postpartum booklet. Activity: Advance as tolerated. Pelvic rest for 6 weeks.  Diet: {OB IOMB:55974163} Future Appointments:No future appointments. Follow up Visit:  The following message was sent to [facility] by Mikki Santee, MD  Please schedule this patient for a In person postpartum visit in 4 weeks with the following provider: Any provider. Additional Postpartum F/U: none   High risk pregnancy complicated by:  FGR Delivery mode:  Vaginal, Spontaneous  Anticipated Birth Control:  IUD   06/12/2022 Chiagoziem Sherrilyn Rist, MD

## 2022-06-12 NOTE — Progress Notes (Signed)
Kaitlyn Frank is a 26 y.o. G3P2002 at [redacted]w[redacted]d by LMP admitted for induction of labor due to Poor fetal growth at 4.4%tile.   Subjective: Introductions exchanged. Patient now comfortable with epidural and is excited to meet baby boy.   Objective: BP 97/65   Pulse 87   Temp 98.4 F (36.9 C) (Oral)   Resp 18   Ht 5\' 3"  (1.6 m)   Wt 57.4 kg   LMP 09/09/2021   SpO2 100%   BMI 22.41 kg/m  No intake/output data recorded. No intake/output data recorded.  FHT:  FHR: 135 bpm, variability: moderate,  accelerations:  Present,  decelerations:  Absent UC:   regular, every 2-3 minutes SVE:   Dilation: 3.5 Effacement (%): 80 Station: -2 Exam by:: Shay CNM  Labs: Lab Results  Component Value Date   WBC 8.0 06/12/2022   HGB 10.3 (L) 06/12/2022   HCT 30.6 (L) 06/12/2022   MCV 91.6 06/12/2022   PLT 226 06/12/2022   Procedure: CNM to patient bedside to discuss AROM. Both Risks and benefits reviewed. Patient agrees to plan of care. With permission SVE performed: and cervix noted to be anterior. Fetal head well applied to cervix. With [place] 1 attempt. Scant fluid noted. Bag no longer felt. FHT Cat I prior to AROM. FHT remained Cat I post AROM.     Assessment / Plan: Induction of labor due to fetal growth restriction in the 4th %time. S/p Cytotec x1. Minimal change from previous exam.   Labor:  AROM scant clear fluid at 1030. Patient contractions every 2-3 mins. Plan to initiated Pit if contractions space and labor stalls.  Fetal Wellbeing:  Category I Pain Control:  Epidural I/D:   GBS Positive  Anticipated MOD:  NSVD  06/14/2022, CNM 06/12/2022, 10:50 AM

## 2022-06-12 NOTE — Progress Notes (Signed)
Kaitlyn Frank is a 26 y.o. G3P2002 at [redacted]w[redacted]d by LMP admitted for induction of labor due to Poor fetal growth.  Subjective: Patient resting well.   Objective: BP 98/70   Pulse 87   Temp 98.6 F (37 C) (Oral)   Resp 18   Ht 5\' 3"  (1.6 m)   Wt 57.4 kg   LMP 09/09/2021   SpO2 100%   BMI 22.41 kg/m  No intake/output data recorded. No intake/output data recorded.  FHT:  FHR: 135 bpm, variability: moderate,  accelerations:  Present,  decelerations:  Absent UC:   regular, every 3-4 minutes SVE:   Dilation: 4 Effacement (%): 80 Station: -2 Exam by:: [email or web address] CNM  Labs: Lab Results  Component Value Date   WBC 8.0 06/12/2022   HGB 10.3 (L) 06/12/2022   HCT 30.6 (L) 06/12/2022   MCV 91.6 06/12/2022   PLT 226 06/12/2022    Assessment / Plan: Induction of labor due to IUGR, s/p Cytotec x1 and Possible AROM.  Labor:  Minimal cervical change. Initiate Pit 2x2 and titrate as needed.  Recommended frequent position changes and use of the peanut ball to encourage fetal descent into the pelvis and labor progression.  Fetal Wellbeing:  Category I Pain Control:  Epidural I/D:   GBS Positive  < PCN  Anticipated MOD:  NSVD  06/14/2022, CNM 06/12/2022, 2:49 PM

## 2022-06-12 NOTE — Progress Notes (Signed)
Kaitlyn Frank is a 26 y.o. G3P2002 at [redacted]w[redacted]d by LMP admitted for induction of labor due to Poor fetal growth 4.4%tile.   Subjective: Patient resting well with epidural placement. Patient sisters at bedside and supportive.   Objective: BP (!) 98/58   Pulse 67   Temp 98.9 F (37.2 C) (Oral)   Resp 18   Ht 5\' 3"  (1.6 m)   Wt 57.4 kg   LMP 09/09/2021   SpO2 100%   BMI 22.41 kg/m  I/O last 3 completed shifts: In: -  Out: 800 [Urine:800] No intake/output data recorded.  FHT:  FHR: 145 bpm, variability: moderate,  accelerations:  Present,  decelerations:  Absent UC:   regular, every 2-3 minutes SVE:   Dilation: 5.5 Effacement (%): 80 Station: -2 Exam by:: [email or web address] CNM  Labs: Lab Results  Component Value Date   WBC 8.0 06/12/2022   HGB 10.3 (L) 06/12/2022   HCT 30.6 (L) 06/12/2022   MCV 91.6 06/12/2022   PLT 226 06/12/2022   Procedure: Upon SVE bag of fluid noted. With patient permission, AROM completed successfully without difficulty. Large amount of clear fluid noted and fetal head well applied to cervix. FHT Cat 1 prior to AROM and remained Cat I post AROM.   Assessment / Plan: Induction of labor due to IUGR, slowly progressing on pitocin. S/P AROM   Labor:  Continue to titrate Pit as needed.  Fetal Wellbeing:  Category I Pain Control:  Epidural I/D:   GBS Positive adequately treated.  Anticipated MOD:  NSVD  06/14/2022, CNM 06/12/2022, 7:32 PM

## 2022-06-12 NOTE — Anesthesia Procedure Notes (Signed)
Epidural Patient location during procedure: OB Start time: 06/12/2022 9:42 AM End time: 06/12/2022 9:50 AM  Staffing Anesthesiologist: Mal Amabile, MD Performed: anesthesiologist   Preanesthetic Checklist Completed: patient identified, IV checked, site marked, risks and benefits discussed, surgical consent, monitors and equipment checked, pre-op evaluation and timeout performed  Epidural Patient position: sitting Prep: DuraPrep and site prepped and draped Patient monitoring: continuous pulse ox and blood pressure Approach: midline Location: L4-L5 Injection technique: LOR air  Needle:  Needle type: Tuohy  Needle gauge: 17 G Needle length: 9 cm and 9 Needle insertion depth: 4 cm Catheter type: closed end flexible Catheter size: 19 Gauge Catheter at skin depth: 9 cm Test dose: negative and Other  Assessment Events: blood not aspirated, no cerebrospinal fluid, injection not painful, no injection resistance, no paresthesia and negative IV test  Additional Notes Patient identified. Risks and benefits discussed including failed block, incomplete  Pain control, post dural puncture headache, nerve damage, paralysis, blood pressure Changes, nausea, vomiting, reactions to medications-both toxic and allergic and post Partum back pain. All questions were answered. Patient expressed understanding and wished to proceed. Sterile technique was used throughout procedure. Epidural site was Dressed with sterile barrier dressing. No paresthesias, signs of intravascular injection Or signs of intrathecal spread were encountered.  Patient was more comfortable after the epidural was dosed. Please see RN's note for documentation of vital signs and FHR which are stable. Reason for block:procedure for pain

## 2022-06-12 NOTE — Progress Notes (Signed)
Patient Vitals for the past 4 hrs:  BP Temp Temp src Pulse Resp  06/12/22 0720 (!) 90/47 98 F (36.7 C) Oral 85 16   Very crampy, wants epidural before moving on to the next step. FHR Cat 1.  Will eat a light breakfast, get epidural, then AROM vs Pitocin.

## 2022-06-12 NOTE — Anesthesia Preprocedure Evaluation (Signed)
Anesthesia Evaluation  Patient identified by MRN, date of birth, ID band Patient awake    Reviewed: Allergy & Precautions, Patient's Chart, lab work & pertinent test results  Airway Mallampati: II  TM Distance: >3 FB Neck ROM: Full    Dental no notable dental hx. (+) Teeth Intact   Pulmonary neg pulmonary ROS,    Pulmonary exam normal breath sounds clear to auscultation       Cardiovascular negative cardio ROS Normal cardiovascular exam Rhythm:Regular Rate:Normal     Neuro/Psych negative neurological ROS  negative psych ROS   GI/Hepatic Neg liver ROS, GERD  ,  Endo/Other  negative endocrine ROS  Renal/GU negative Renal ROS  negative genitourinary   Musculoskeletal negative musculoskeletal ROS (+)   Abdominal   Peds  Hematology  (+) Blood dyscrasia, anemia ,   Anesthesia Other Findings   Reproductive/Obstetrics (+) Pregnancy IUGR                             Anesthesia Physical  Anesthesia Plan  ASA: II  Anesthesia Plan: Epidural   Post-op Pain Management:    Induction:   PONV Risk Score and Plan:   Airway Management Planned: Natural Airway  Additional Equipment:   Intra-op Plan:   Post-operative Plan:   Informed Consent: I have reviewed the patients History and Physical, chart, labs and discussed the procedure including the risks, benefits and alternatives for the proposed anesthesia with the patient or authorized representative who has indicated his/her understanding and acceptance.       Plan Discussed with: Anesthesiologist  Anesthesia Plan Comments:         Anesthesia Quick Evaluation

## 2022-06-13 ENCOUNTER — Encounter (HOSPITAL_COMMUNITY): Payer: Self-pay | Admitting: Obstetrics and Gynecology

## 2022-06-13 MED ORDER — ACETAMINOPHEN 325 MG PO TABS
650.0000 mg | ORAL_TABLET | ORAL | Status: DC | PRN
  Administered 2022-06-13: 650 mg via ORAL
  Filled 2022-06-13: qty 2

## 2022-06-13 MED ORDER — PRENATAL MULTIVITAMIN CH
1.0000 | ORAL_TABLET | Freq: Every day | ORAL | Status: DC
  Administered 2022-06-13 – 2022-06-14 (×2): 1 via ORAL
  Filled 2022-06-13 (×2): qty 1

## 2022-06-13 MED ORDER — SODIUM CHLORIDE 0.9% FLUSH
3.0000 mL | Freq: Two times a day (BID) | INTRAVENOUS | Status: DC
  Administered 2022-06-13: 3 mL via INTRAVENOUS

## 2022-06-13 MED ORDER — DIPHENHYDRAMINE HCL 25 MG PO CAPS: 25.0000 mg | ORAL_CAPSULE | Freq: Four times a day (QID) | ORAL | Status: DC | PRN

## 2022-06-13 MED ORDER — ONDANSETRON HCL 4 MG/2ML IJ SOLN: 4.0000 mg | INTRAMUSCULAR | Status: DC | PRN

## 2022-06-13 MED ORDER — OXYCODONE HCL 5 MG PO TABS: 5.0000 mg | ORAL_TABLET | ORAL | Status: DC | PRN

## 2022-06-13 MED ORDER — ZOLPIDEM TARTRATE 5 MG PO TABS: 5.0000 mg | ORAL_TABLET | Freq: Every evening | ORAL | Status: DC | PRN

## 2022-06-13 MED ORDER — ONDANSETRON HCL 4 MG PO TABS: 4.0000 mg | ORAL_TABLET | ORAL | Status: DC | PRN

## 2022-06-13 MED ORDER — SENNOSIDES-DOCUSATE SODIUM 8.6-50 MG PO TABS
2.0000 | ORAL_TABLET | ORAL | Status: DC
  Administered 2022-06-13 – 2022-06-14 (×2): 2 via ORAL
  Filled 2022-06-13 (×2): qty 2

## 2022-06-13 MED ORDER — SIMETHICONE 80 MG PO CHEW: 80.0000 mg | CHEWABLE_TABLET | ORAL | Status: DC | PRN

## 2022-06-13 MED ORDER — DIBUCAINE (PERIANAL) 1 % EX OINT: 1.0000 | TOPICAL_OINTMENT | CUTANEOUS | Status: DC | PRN

## 2022-06-13 MED ORDER — SODIUM CHLORIDE 0.9% FLUSH: 3.0000 mL | INTRAVENOUS | Status: DC | PRN

## 2022-06-13 MED ORDER — FERROUS SULFATE 325 (65 FE) MG PO TABS
325.0000 mg | ORAL_TABLET | ORAL | Status: DC
  Administered 2022-06-13: 325 mg via ORAL
  Filled 2022-06-13: qty 1

## 2022-06-13 MED ORDER — BENZOCAINE-MENTHOL 20-0.5 % EX AERO: 1.0000 | INHALATION_SPRAY | CUTANEOUS | Status: DC | PRN

## 2022-06-13 MED ORDER — WITCH HAZEL-GLYCERIN EX PADS: 1.0000 | MEDICATED_PAD | CUTANEOUS | Status: DC | PRN

## 2022-06-13 MED ORDER — COCONUT OIL OIL: 1.0000 | TOPICAL_OIL | Status: DC | PRN

## 2022-06-13 MED ORDER — SODIUM CHLORIDE 0.9 % IV SOLN: INTRAVENOUS | Status: DC | PRN

## 2022-06-13 MED ORDER — IBUPROFEN 600 MG PO TABS
600.0000 mg | ORAL_TABLET | Freq: Four times a day (QID) | ORAL | Status: DC
  Administered 2022-06-13 – 2022-06-14 (×6): 600 mg via ORAL
  Filled 2022-06-13 (×6): qty 1

## 2022-06-13 NOTE — Lactation Note (Signed)
This note was copied from a baby's chart. Lactation Consultation Note  Patient Name: Kaitlyn Frank [id number] Date: 06/13/2022   Age:27 hours   LC Note:  Mother requested latch assistance when I was assisting another family.  RN went in to assist.  However, baby was not successful with latching and feeding due to sleepiness.    Patent was originally a "complete" status because she declined lactation assistance.  Will follow up later today if she desires a lactation consult; RN to call as desired.   Maternal Data    Feeding    LATCH Score                    Lactation Tools Discussed/Used    Interventions    Discharge    Consult Status      Alizaya Oshea R Jaice Lague 06/13/2022, 11:59 AM

## 2022-06-13 NOTE — Progress Notes (Signed)
POSTPARTUM PROGRESS NOTE  PPD #1  Subjective:  Kaitlyn Frank is a 26 y.o. [id number] s/p NSVD at [redacted]w[redacted]d. Today she notes no acute complaints or concerns. She denies any problems with ambulating, voiding or po intake. Denies nausea or vomiting. She has passed flatus, no BM.  Pain is well controlled.  Lochia improving Denies fever/chills/chest pain/SOB.   Objective: Blood pressure 105/73, pulse 88, temperature 98.3 F (36.8 C), temperature source Oral, resp. rate 16, height 5\' 3"  (1.6 m), weight 57.4 kg, last menstrual period 09/09/2021, SpO2 99 %, unknown if currently breastfeeding.  Physical Exam:  General: alert, cooperative and no distress Chest: no respiratory distress Heart: regular rate and rhythm Abdomen: soft, nontender Uterine Fundus: firm, appropriately tender DVT Evaluation: No calf swelling or tenderness Extremities: no edema Skin: warm, dry  No results found for this or any previous visit (from the past 24 hour(s)).  Assessment/Plan: Carolynne Schuchard is a 26 y.o. [phone] s/p NSVD at [redacted]w[redacted]d PPD#1  -meeting postpartum milestones appropriately -desires circ- consent obtained see separate note  Contraception: desires IUD as outpatient Feeding: breast  Dispo: continue routine postpartum care, plan for discharge home tomorrow   LOS: 2 days   [redacted]w[redacted]d, DO Faculty Attending, [facility] 06/13/2022, 9:08 PM

## 2022-06-13 NOTE — Anesthesia Postprocedure Evaluation (Signed)
Anesthesia Post Note  Patient: Public relations account executive  Procedure(s) Performed: AN AD HOC LABOR EPIDURAL     Patient location during evaluation: Mother Baby Anesthesia Type: Epidural Level of consciousness: awake and alert Pain management: pain level controlled Vital Signs Assessment: post-procedure vital signs reviewed and stable Respiratory status: spontaneous breathing, nonlabored ventilation and respiratory function stable Cardiovascular status: stable Postop Assessment: no headache, no backache and epidural receding Anesthetic complications: no   No notable events documented.  Last Vitals:  Vitals:   06/13/22 0201 06/13/22 0539  BP: 97/63 97/69  Pulse: 73 62  Resp: 18 17  Temp: 36.7 C (!) 36.4 C  SpO2: 100% 100%    Last Pain:  Vitals:   06/13/22 0655  TempSrc:   PainSc: Asleep   Pain Goal:                   Rica Records

## 2022-06-13 NOTE — Progress Notes (Signed)
Circumcision Consent ° °Discussed with mom at bedside about circumcision.  ° °Circumcision is a surgery that removes the skin that covers the tip of the penis, called the "foreskin." Circumcision is usually done when a boy is between 1 and 10 days old, sometimes up to 3-4 weeks old. ° °The most common reasons boys are circumcised include for cultural/religious beliefs or for parental preference (potentially easier to clean, so baby looks like daddy, etc). ° °There may be some medical benefits for circumcision:  ° °Circumcised boys seem to have slightly lower rates of: °? Urinary tract infections (per the American Academy of Pediatrics an uncircumcised boy has a 1/100 chance of developing a UTI in the first year of life, a circumcised boy at a 06/998 chance of developing a UTI in the first year of life- a 10% reduction) °? Penis cancer (typically rare- an uncircumcised female has a 1 in 100,000 chance of developing cancer of the penis) °? Sexually transmitted infection (in endemic areas, including HIV, HPV and Herpes- circumcision does NOT protect against gonorrhea, chlamydia, trachomatis, or syphilis) °? Phimosis: a condition where that makes retraction of the foreskin over the glans impossible (0.4 per 1000 boys per year or 0.6% of boys are affected by their 15th birthday) ° °Boys and men who are not circumcised can reduce these extra risks by: °? Cleaning their penis well °? Using condoms during sex ° °What are the risks of circumcision? ° °As with any surgical procedure, there are risks and complications. In circumcision, complications are rare and usually minor, the most common being: °? Bleeding- risk is reduced by holding each clamp for 30 seconds prior to a cut being made, and by holding pressure after the procedure is done °? Infection- the penis is cleaned prior to the procedure, and the procedure is done under sterile technique °? Damage to the urethra or amputation of the penis ° °How is circumcision done  in baby boys? ° °The baby will be placed on a special table and the legs restrained for their safety. Numbing medication is injected into the penis, and the skin is cleansed with betadine to decrease the risk of infection.  ° °What to expect: ° °The penis will look red and raw for 5-7 days as it heals. We expect scabbing around where the cut was made, as well as clear-pink fluid and some swelling of the penis right after the procedure. °If your baby's circumcision starts to bleed or develops pus, please contact your pediatrician immediately. ° °All questions were answered and mother consented. ° °Kaitlyn Frank M Kaitlyn Frank °  °

## 2022-06-14 MED ORDER — WHITE PETROLATUM EX OINT: 1.0000 | TOPICAL_OINTMENT | CUTANEOUS | Status: DC | PRN

## 2022-06-14 MED ORDER — LIDOCAINE 1% INJECTION FOR CIRCUMCISION
0.8000 mL | INJECTION | Freq: Once | INTRAVENOUS | Status: DC
  Filled 2022-06-14: qty 1

## 2022-06-14 MED ORDER — SUCROSE 24% NICU/PEDS ORAL SOLUTION: 0.5000 mL | OROMUCOSAL | Status: DC | PRN

## 2022-06-14 MED ORDER — IBUPROFEN 600 MG PO TABS: 600.0000 mg | ORAL_TABLET | Freq: Four times a day (QID) | ORAL | 0 refills | Status: AC

## 2022-06-14 MED ORDER — EPINEPHRINE TOPICAL FOR CIRCUMCISION 0.1 MG/ML: 1.0000 [drp] | TOPICAL | Status: DC | PRN

## 2022-06-14 MED ORDER — GELATIN ABSORBABLE 12-7 MM EX MISC: 1.0000 | Freq: Once | CUTANEOUS | Status: DC | PRN

## 2022-06-18 ENCOUNTER — Other Ambulatory Visit: Payer: Self-pay

## 2022-06-18 ENCOUNTER — Encounter: Payer: Self-pay | Admitting: Obstetrics and Gynecology

## 2022-06-24 ENCOUNTER — Telehealth (HOSPITAL_COMMUNITY): Payer: Self-pay | Admitting: *Deleted

## 2022-06-24 NOTE — Telephone Encounter (Signed)
Left phone voicemail message.  Duffy Rhody, RN 06-24-2022 at 12:09pm

## 2022-07-11 IMAGING — US US MFM FETAL BPP W/O NON-STRESS
1 series · 12 of 28 positions shown · non-contrast
Comparison: none

[Series 1: us mfm fetal bpp w/o non-stress · 33 acquisitions, 12 frames shown]
[im 2/33]
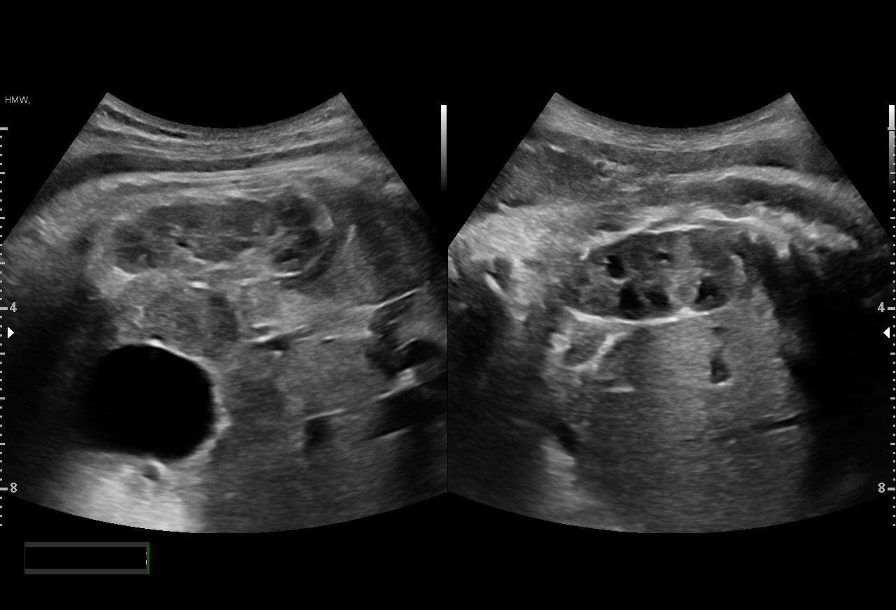
[im 4/33]
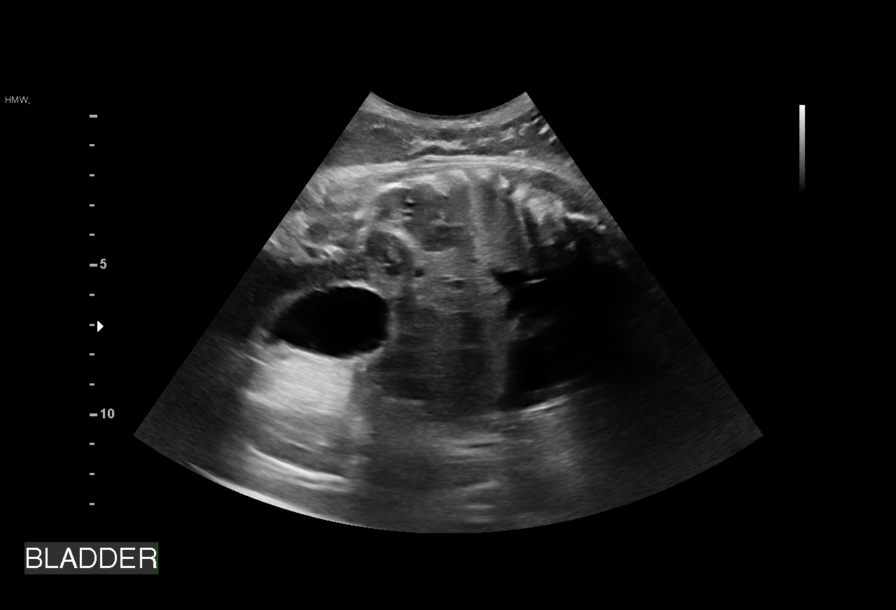
[im 6/33]
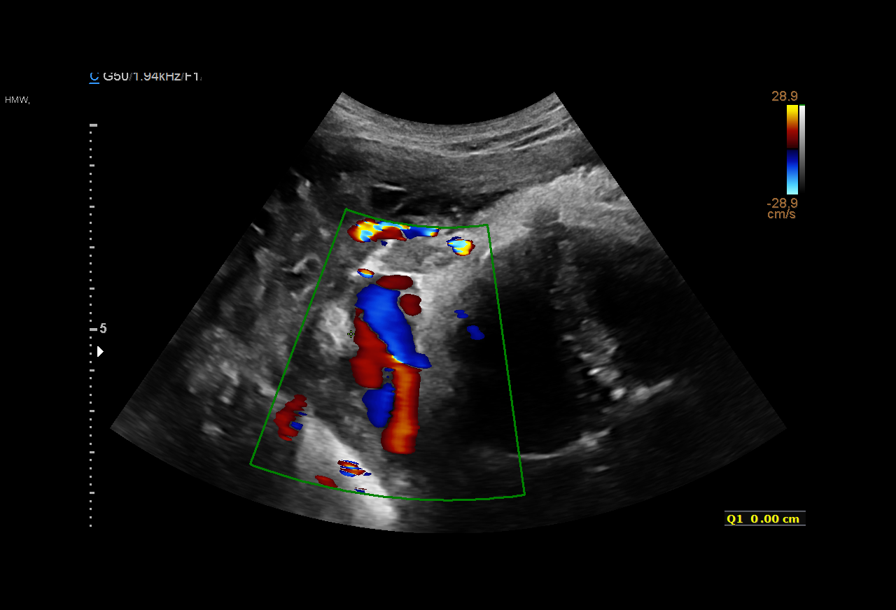
[im 10/33]
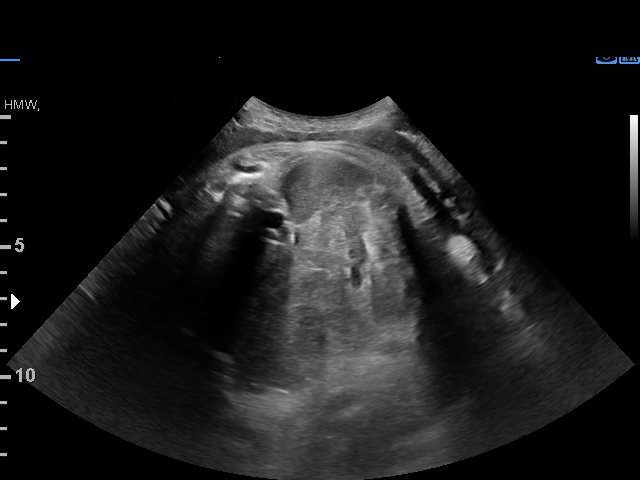
[im 12/33]
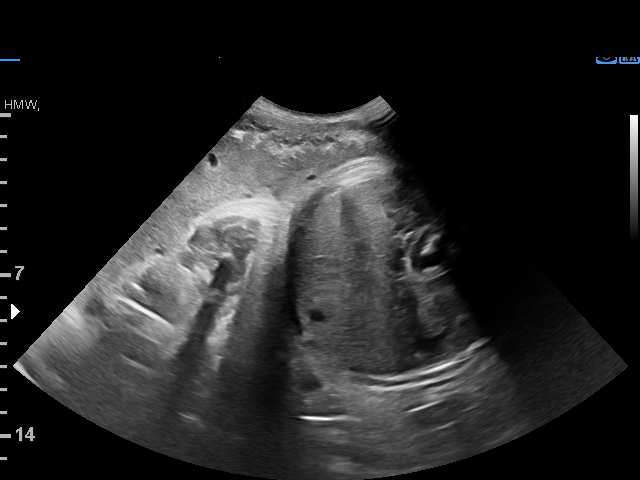
[im 15/33]
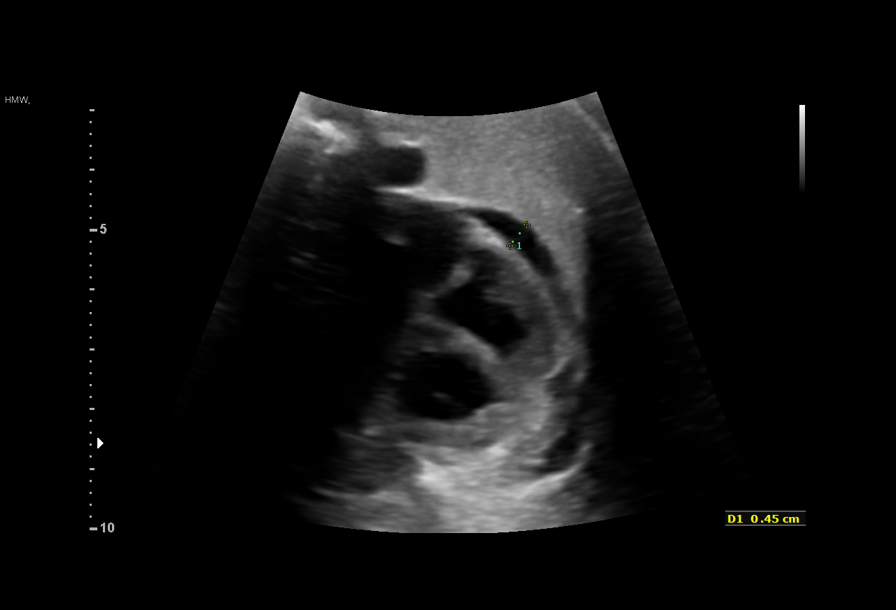
[im 18/33]
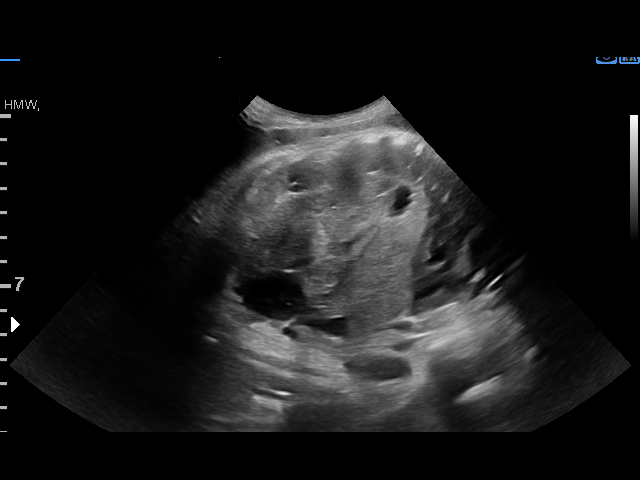
[im 21/33]
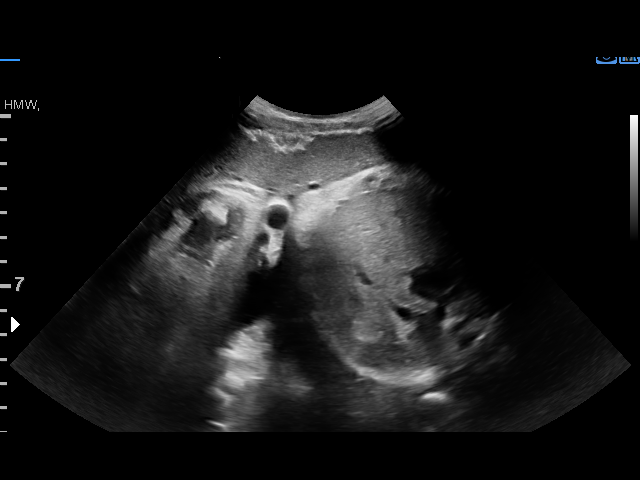
[im 23/33]
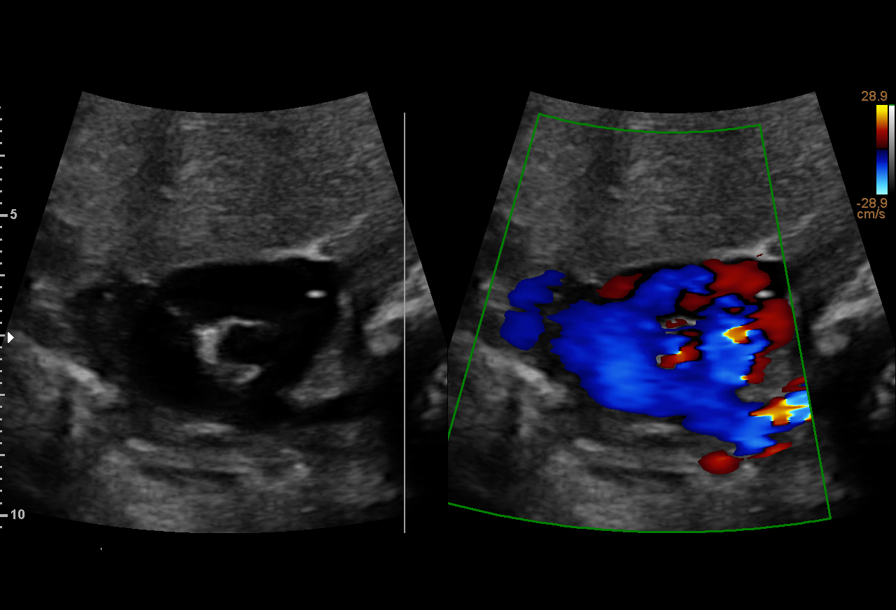
[im 27/33]
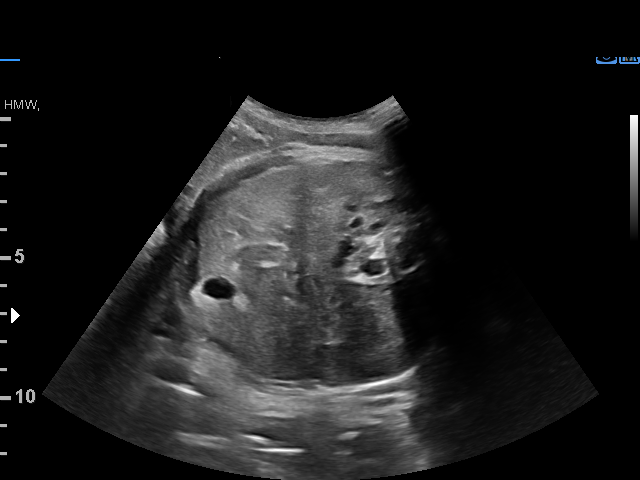
[im 29/33]
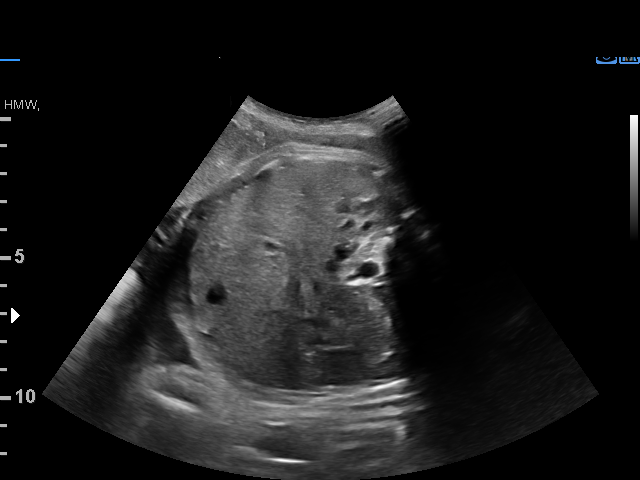
[im 31/33]
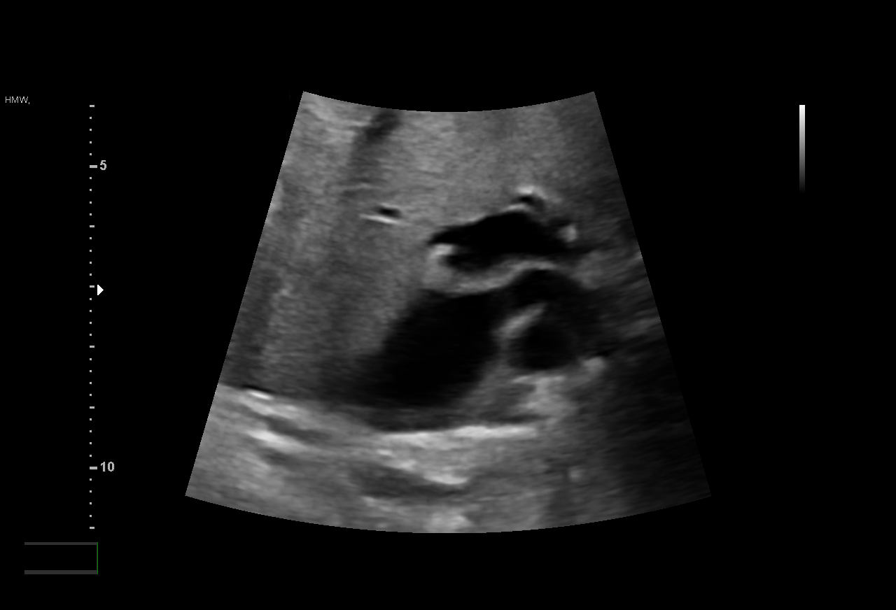

[12 of 28 positions shown; findings below may reference images not displayed]

TIGER

Indications

 36 weeks gestation of pregnancy
 Encounter for other antenatal screening
 follow-up
 Late to prenatal care, third trimester
 Oligohydraminios, third trimester, unspecified
Fetal Evaluation

 Num Of Fetuses:         1
 Fetal Heart Rate(bpm):  123
 Cardiac Activity:       Observed
 Presentation:           Cephalic
 Placenta:               Anterior

 Amniotic Fluid
 AFI FV:      Subjectively low-normal

 AFI Sum(cm)     %Tile       Largest Pocket(cm)
 5.9             < 3

 RUQ(cm)       RLQ(cm)       LUQ(cm)        LLQ(cm)
 0             5.9           0              0
Biophysical Evaluation
 Amniotic F.V:   Pocket => 2 cm             F. Tone:        Observed
 F. Movement:    Observed                   Score:          [DATE]
 F. Breathing:   Observed
OB History

 Gravidity:    2         Term:   1
 Living:       1
Gestational Age

 Clinical EDD:  39w 4d                                        EDD:   02/19/20
 Best:          36w 3d     Det. By:  U/S  (02/09/20)          EDD:   03/12/20
Anatomy

 Thoracic:              Appears normal         Kidneys:                Appear normal
 RVOT:                  Appears normal         Bladder:                Appears normal
 LVOT:                  Appears normal
Cervix Uterus Adnexa

 Cervix
 Not visualized (advanced GA >55wks)
Impression

 Antenatal testing for low amniotic fluid
 Biophysical profile [DATE] with normal amniotic fluid and fetal
 movement
 The amniotic fluid overall is subjectively low however,
 objectively normal with an AFI of 5.9 and MVP
Recommendations

 Follow up amniotic fluid in 1 week.

## 2022-07-17 ENCOUNTER — Ambulatory Visit: Payer: Medicaid Other | Admitting: Advanced Practice Midwife

## 2022-09-01 ENCOUNTER — Other Ambulatory Visit: Payer: Self-pay

## 2022-09-01 ENCOUNTER — Ambulatory Visit (INDEPENDENT_AMBULATORY_CARE_PROVIDER_SITE_OTHER): Payer: Medicaid Other | Admitting: Advanced Practice Midwife

## 2022-09-01 VITALS — BP 115/76 | HR 79 | Wt 129.3 lb

## 2022-09-01 DIAGNOSIS — B3789 Other sites of candidiasis: Secondary | ICD-10-CM

## 2022-09-01 NOTE — Progress Notes (Deleted)
   Established Patient Office Visit  Subjective   Patient ID: Shirlie Heichelbech, female    DOB: 07/13/1995  Age: 27 y.o. MRN: [id number]  No chief complaint on file.   HPI  {History (Optional):23778}  ROS    Objective:     BP 115/76   Pulse 79   Wt 129 lb 4.8 oz (58.7 kg)   LMP 09/09/2021   BMI 22.90 kg/m  {Vitals History (Optional):23777}  Physical Exam   No results found for any visits on 09/01/22.  {Labs (Optional):23779}  The ASCVD Risk score (Arnett DK, et al., 2019) failed to calculate for the following reasons:   The 2019 ASCVD risk score is only valid for ages 11 to 79    Assessment & Plan:   Problem List Items Addressed This Visit   None Visit Diagnoses     Yeast infection of nipple, postpartum    -  Primary       Return in about 1 week (around 09/08/2022), or if symptoms worsen or fail to improve.    Manya Silvas, CNM

## 2022-09-01 NOTE — Progress Notes (Signed)
   Subjective:    Patient ID: Kaitlyn Frank, female    DOB: Mar 18, 1996, 27 y.o.   MRN: [id number]  Here for concern for nipple yeast. Reports itching, nipple cracking and white patches in baby's mouth. Has Peds appt today. Has not tried  anything to Tx herself or baby.     Review of Systems  Constitutional:  Negative for chills and fever.  See HPI    Objective:   Physical Exam Constitutional:      Appearance: Normal appearance. She is not ill-appearing.  Chest:     Chest wall: No mass, swelling, tenderness or edema.  Breasts:    Right: Skin change present. No swelling, bleeding, inverted nipple, mass or tenderness.     Left: Skin change present. No swelling, bleeding, inverted nipple, mass or tenderness.     Comments: Scaly skin and cracking of skin of bilat nipples Skin:    General: Skin is warm and dry.  Neurological:     Mental Status: She is alert.         Assessment & Plan:  1. Yeast infection of nipple, postpartum  Plan Rx All Purpose Nipple Ointment Candida protocol Brief discussion of infant Tx including considering Gentian Violet.  Recommend LC if no improvement in 1 week.  Manya Silvas, CNM

## 2022-09-10 ENCOUNTER — Ambulatory Visit (INDEPENDENT_AMBULATORY_CARE_PROVIDER_SITE_OTHER): Payer: Medicaid Other | Admitting: Obstetrics and Gynecology

## 2022-09-10 ENCOUNTER — Other Ambulatory Visit: Payer: Self-pay

## 2022-09-10 ENCOUNTER — Encounter: Payer: Self-pay | Admitting: Obstetrics and Gynecology

## 2022-09-10 VITALS — BP 103/64 | HR 66 | Ht 63.0 in | Wt 128.0 lb

## 2022-09-10 DIAGNOSIS — Z30017 Encounter for initial prescription of implantable subdermal contraceptive: Secondary | ICD-10-CM | POA: Diagnosis not present

## 2022-09-10 MED ORDER — ETONOGESTREL 68 MG ~~LOC~~ IMPL
68.0000 mg | DRUG_IMPLANT | Freq: Once | SUBCUTANEOUS | Status: AC
  Administered 2022-09-10: 68 mg via SUBCUTANEOUS

## 2022-09-10 NOTE — Progress Notes (Signed)
    Kaitlyn Frank Visit Note  Kaitlyn Frank is a 27 y.o. [phone] female who presents for a postpartum visit. She is  14  weeks postpartum following a normal spontaneous vaginal delivery.  I have fully reviewed the prenatal and intrapartum course. The delivery was at [redacted]w[redacted]d gestational weeks.  Anesthesia: epidural. Postpartum course has been good. Baby is doing well. Baby is feeding by breast. Bleeding no bleeding. Bowel function is normal. Bladder function is normal. Patient is not sexually active. Contraception method is Nexplanon. Postpartum depression screening: negative.   The pregnancy intention screening data noted above was reviewed. Potential methods of contraception were discussed. The patient elected to proceed with No data recorded.    Health Maintenance Due  Topic Date Due   COVID-19 Vaccine (1) Never done   INFLUENZA VACCINE  01/27/2022    The following portions of the patient's history were reviewed and updated as appropriate: allergies, current medications, past family history, past medical history, past social history, past surgical history, and problem list.  Review of Systems Pertinent items are noted in HPI.  Objective:  There were no vitals taken for this visit.   General:  alert and cooperative   Breasts:  not indicated  Lungs: Normal effort  Heart:  regular rate and rhythm, S1, S2 normal, no murmur, click, rub or gallop  Abdomen: soft, non-tender; bowel sounds normal; no masses,  no organomegaly   Wound N/a  GU exam:  not indicated       Assessment:   1. Encounter for initial prescription of implantable subdermal contraceptive See separate note   normal postpartum exam.   Plan:   Essential components of care per ACOG recommendations:  1.  Mood and well being: Patient with negative depression screening today. Reviewed local resources for support.  - Patient tobacco use? No.   - hx of drug use? No.    2. Infant care and feeding:  -Patient  currently breastmilk feeding? Yes. Discussed returning to work and pumping.  -Social determinants of health ([place]) reviewed in [place]. No concerns  3. Sexuality, contraception and birth spacing - Patient does not want a pregnancy in the next year.  Desired family size is 5 children.  - Reviewed reproductive life planning. Reviewed contraceptive methods based on pt preferences and effectiveness.  Patient desired Hormonal Implant today.   - Discussed birth spacing of 18 months  4. Sleep and fatigue -Encouraged family/partner/community support of 4 hrs of uninterrupted sleep to help with mood and fatigue  5. Physical Recovery  - Discussed patients delivery and complications. She describes her labor as good. - Patient had a Vaginal, no problems at delivery. Patient had no laceration. Perineal healing reviewed. Patient expressed understanding - Patient has urinary incontinence? No. - Patient is safe to resume physical and sexual activity  6.  Health Maintenance - Last pap smear  Diagnosis  Date Value Ref Range Status  04/11/2020   Final   - Negative for intraepithelial lesion or malignancy (NILM)   Pap smear not done at today's visit.  -Breast Cancer screening indicated? No.   7. Chronic Disease/Pregnancy Condition follow up: None  - PCP follow up  Kaitlyn Frank, [place] [facility] [place], MD Minimally Invasive Gynecologic Surgery and Chronic Pelvic Pain Specialist Obstetrics and Gynecology, [facility] for [facility], [facility] 09/14/22

## 2022-09-10 NOTE — Progress Notes (Signed)
Patient ID: Kaitlyn Frank, female   DOB: Jan 11, 1996, 27 y.o.   MRN: [id number]  Nexplanon Insertion Procedure Patient identified, informed consent performed, consent signed.   Patient does understand that irregular bleeding is a very common side effect of this medication. She was advised to have backup contraception for one week after placement. Pregnancy test in clinic today was negative.  Appropriate time out taken.  Patient's left arm was prepped and draped in the usual sterile fashion. The area of insertion was noted on the left arm.  Patient was prepped with alcohol swab and then injected with 3 ml of 1% lidocaine.  She was prepped with betadine, Nexplanon removed from packaging,  Device confirmed in needle, then inserted full length of needle and withdrawn per handbook instructions. Nexplanon was able to palpated in the patient's arm; patient *** palpated the insert herself. There was minimal blood loss.  Patient insertion site covered with guaze and a pressure bandage to reduce any bruising.  The patient tolerated the procedure well and was given post procedure instructions.

## 2022-09-10 NOTE — Patient Instructions (Addendum)
[email or web address]-[email or web address]   Dr. Pollie Friar nipple cream

## 2024-08-14 ENCOUNTER — Ambulatory Visit: Payer: Self-pay | Admitting: Certified Nurse Midwife
